# Patient Record
Sex: Male | Born: 1990 | Race: Black or African American | Hispanic: No | Marital: Single | Smoking: Current some day smoker
Health system: Southern US, Community
[De-identification: ages and names within clinical notes are randomized; demographics above are authoritative.]

## PROBLEM LIST (undated history)

## (undated) DIAGNOSIS — T148XXA Other injury of unspecified body region, initial encounter: Secondary | ICD-10-CM

## (undated) HISTORY — DX: Other injury of unspecified body region, initial encounter: T14.8XXA

---
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
## 2010-01-01 DIAGNOSIS — T148XXA Other injury of unspecified body region, initial encounter: Secondary | ICD-10-CM

## 2010-01-01 HISTORY — PX: ABDOMINAL SURGERY: SHX537

## 2010-01-01 HISTORY — DX: Other injury of unspecified body region, initial encounter: T14.8XXA

## 2010-08-21 ENCOUNTER — Emergency Department (HOSPITAL_COMMUNITY): Payer: Self-pay

## 2010-08-21 ENCOUNTER — Inpatient Hospital Stay (HOSPITAL_COMMUNITY)
Admission: EM | Admit: 2010-08-21 | Discharge: 2010-08-21 | DRG: 580 | Disposition: A | Discharge status: Home / Self Care | Payer: Self-pay | Source: Ambulatory Visit | Attending: General Surgery | Admitting: General Surgery

## 2010-08-21 DIAGNOSIS — D62 Acute posthemorrhagic anemia: Secondary | ICD-10-CM | POA: Diagnosis present

## 2010-08-21 DIAGNOSIS — S31109A Unspecified open wound of abdominal wall, unspecified quadrant without penetration into peritoneal cavity, initial encounter: Principal | ICD-10-CM | POA: Diagnosis present

## 2010-08-21 DIAGNOSIS — F1010 Alcohol abuse, uncomplicated: Secondary | ICD-10-CM | POA: Diagnosis present

## 2010-08-21 DIAGNOSIS — F17200 Nicotine dependence, unspecified, uncomplicated: Secondary | ICD-10-CM | POA: Diagnosis present

## 2010-08-21 DIAGNOSIS — F1210 Cannabis abuse, uncomplicated: Secondary | ICD-10-CM | POA: Diagnosis present

## 2010-08-21 LAB — CBC
Hemoglobin: 10.7 g/dL — ABNORMAL LOW (ref 13.0–17.0)
MCHC: 33.9 g/dL (ref 30.0–36.0)
Platelets: 251 10*3/uL (ref 150–400)
Platelets: 217 10*3/uL (ref 150–400)
RBC: 4.9 MIL/uL (ref 4.22–5.81)
WBC: 8.6 10*3/uL (ref 4.0–10.5)

## 2010-08-21 LAB — LACTIC ACID, PLASMA: Lactic Acid, Venous: 4.8 mmol/L — ABNORMAL HIGH (ref 0.5–2.2)

## 2010-08-21 LAB — COMPREHENSIVE METABOLIC PANEL
AST: 16 U/L (ref 0–37)
Albumin: 4.5 g/dL (ref 3.5–5.2)
CO2: 24 mEq/L (ref 19–32)
Calcium: 10 mg/dL (ref 8.4–10.5)
Creatinine, Ser: 1.1 mg/dL (ref 0.50–1.35)
GFR calc non Af Amer: >60 mL/min (ref >60)

## 2010-08-21 LAB — TYPE AND SCREEN: Unit division: 0

## 2010-08-21 LAB — POCT I-STAT, CHEM 8
Hemoglobin: 15.6 g/dL (ref 13.0–17.0)
Sodium: 140 meq/L (ref 135–145)
TCO2: 20 mmol/L (ref 0–100)

## 2010-08-21 LAB — ETHANOL: Alcohol, Ethyl (B): 47 mg/dL — ABNORMAL HIGH (ref 0–11)

## 2010-08-21 LAB — ABO/RH: ABO/RH(D): AB NEG

## 2010-08-21 LAB — PROTIME-INR: INR: 0.95 (ref 0.00–1.49)

## 2010-08-21 LAB — SAMPLE TO BLOOD BANK

## 2010-08-21 MED ORDER — IOHEXOL 300 MG/ML  SOLN
100.0000 mL | Freq: Once | INTRAMUSCULAR | Status: AC | PRN
  Administered 2010-08-21: 100 mL via INTRAVENOUS

## 2010-08-27 ENCOUNTER — Emergency Department (INDEPENDENT_AMBULATORY_CARE_PROVIDER_SITE_OTHER): Payer: Self-pay

## 2010-08-27 ENCOUNTER — Encounter: Payer: Self-pay | Admitting: *Deleted

## 2010-08-27 ENCOUNTER — Emergency Department (HOSPITAL_BASED_OUTPATIENT_CLINIC_OR_DEPARTMENT_OTHER)
Admission: EM | Admit: 2010-08-27 | Discharge: 2010-08-27 | Disposition: A | Discharge status: Home / Self Care | Payer: Self-pay | Attending: Emergency Medicine | Admitting: Emergency Medicine

## 2010-08-27 DIAGNOSIS — R062 Wheezing: Secondary | ICD-10-CM

## 2010-08-27 DIAGNOSIS — IMO0002 Reserved for concepts with insufficient information to code with codable children: Secondary | ICD-10-CM | POA: Insufficient documentation

## 2010-08-27 DIAGNOSIS — Y849 Medical procedure, unspecified as the cause of abnormal reaction of the patient, or of later complication, without mention of misadventure at the time of the procedure: Secondary | ICD-10-CM | POA: Insufficient documentation

## 2010-08-27 DIAGNOSIS — R509 Fever, unspecified: Secondary | ICD-10-CM

## 2010-08-27 DIAGNOSIS — R05 Cough: Secondary | ICD-10-CM

## 2010-08-27 DIAGNOSIS — J40 Bronchitis, not specified as acute or chronic: Secondary | ICD-10-CM | POA: Insufficient documentation

## 2010-08-27 DIAGNOSIS — F17200 Nicotine dependence, unspecified, uncomplicated: Secondary | ICD-10-CM | POA: Insufficient documentation

## 2010-08-27 MED ORDER — HYDROCODONE-ACETAMINOPHEN 5-325 MG PO TABS
2.0000 | ORAL_TABLET | Freq: Once | ORAL | Status: AC
  Administered 2010-08-27: 2 via ORAL
  Filled 2010-08-27: qty 2

## 2010-08-27 MED ORDER — AZITHROMYCIN 250 MG PO TABS: ORAL_TABLET | ORAL | Status: DC

## 2010-08-27 MED ORDER — IPRATROPIUM BROMIDE 0.02 % IN SOLN
RESPIRATORY_TRACT | Status: AC
  Administered 2010-08-27: 0.5 mg
  Filled 2010-08-27: qty 2.5

## 2010-08-27 MED ORDER — AZITHROMYCIN 250 MG PO TABS
500.0000 mg | ORAL_TABLET | Freq: Once | ORAL | Status: AC
  Administered 2010-08-27: 500 mg via ORAL
  Filled 2010-08-27: qty 2

## 2010-08-27 MED ORDER — ALBUTEROL SULFATE HFA 108 (90 BASE) MCG/ACT IN AERS: 2.0000 | INHALATION_SPRAY | RESPIRATORY_TRACT | Status: DC | PRN

## 2010-08-27 MED ORDER — ALBUTEROL SULFATE (5 MG/ML) 0.5% IN NEBU
INHALATION_SOLUTION | RESPIRATORY_TRACT | Status: AC
  Administered 2010-08-27: 5 mg
  Filled 2010-08-27: qty 1

## 2010-08-27 NOTE — ED Notes (Signed)
Family at bedside.Care plan reviewed wound care reviewed with pt and family benefits of Vicodan reviewed,dangers of smoking reviewed
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
## 2010-08-27 NOTE — ED Notes (Signed)
Family at bedside. 

## 2010-08-27 NOTE — ED Notes (Signed)
Pt verbalizes care plan well family at side use of meds reviewed

## 2010-08-27 NOTE — ED Notes (Signed)
Patient is resting comfortably. Bowel sounds normoactive.  Pt. tolerating PO fluids well; family at bedside; benefits of antibiotic therapy reviewed with pt and family.  States he feels better after medication.

## 2010-08-28 ENCOUNTER — Emergency Department (HOSPITAL_BASED_OUTPATIENT_CLINIC_OR_DEPARTMENT_OTHER)
Admission: EM | Admit: 2010-08-28 | Discharge: 2010-08-28 | Disposition: A | Discharge status: Home / Self Care | Payer: Self-pay | Attending: Emergency Medicine | Admitting: Emergency Medicine

## 2010-08-28 ENCOUNTER — Encounter (HOSPITAL_BASED_OUTPATIENT_CLINIC_OR_DEPARTMENT_OTHER): Payer: Self-pay | Admitting: Student

## 2010-08-28 ENCOUNTER — Emergency Department (INDEPENDENT_AMBULATORY_CARE_PROVIDER_SITE_OTHER): Payer: Self-pay

## 2010-08-28 DIAGNOSIS — R0602 Shortness of breath: Secondary | ICD-10-CM | POA: Insufficient documentation

## 2010-08-28 DIAGNOSIS — W269XXA Contact with unspecified sharp object(s), initial encounter: Secondary | ICD-10-CM

## 2010-08-28 DIAGNOSIS — F17200 Nicotine dependence, unspecified, uncomplicated: Secondary | ICD-10-CM | POA: Insufficient documentation

## 2010-08-28 DIAGNOSIS — R509 Fever, unspecified: Secondary | ICD-10-CM | POA: Insufficient documentation

## 2010-08-28 DIAGNOSIS — R109 Unspecified abdominal pain: Secondary | ICD-10-CM | POA: Insufficient documentation

## 2010-08-28 DIAGNOSIS — Z09 Encounter for follow-up examination after completed treatment for conditions other than malignant neoplasm: Secondary | ICD-10-CM

## 2010-08-28 DIAGNOSIS — S3790XA Unspecified injury of unspecified urinary and pelvic organ, initial encounter: Secondary | ICD-10-CM

## 2010-08-28 LAB — CBC
MCV: 79.5 fL (ref 78.0–100.0)
Platelets: 268 10*3/uL (ref 150–400)
RBC: 4.35 MIL/uL (ref 4.22–5.81)
WBC: 10.3 10*3/uL (ref 4.0–10.5)

## 2010-08-28 LAB — DIFFERENTIAL
Lymphocytes Relative: 13 % (ref 12–46)
Lymphs Abs: 1.4 10*3/uL (ref 0.7–4.0)
Neutro Abs: 8 10*3/uL — ABNORMAL HIGH (ref 1.7–7.7)
Neutrophils Relative %: 78 % — ABNORMAL HIGH (ref 43–77)

## 2010-08-28 LAB — BASIC METABOLIC PANEL
BUN: 18 mg/dL (ref 6–23)
Chloride: 97 mEq/L (ref 96–112)
GFR calc Af Amer: >60 mL/min (ref >60)
Glucose, Bld: 92 mg/dL (ref 70–99)
Potassium: 4 mEq/L (ref 3.5–5.1)
Sodium: 132 mEq/L — ABNORMAL LOW (ref 135–145)

## 2010-08-28 MED ORDER — ONDANSETRON HCL 4 MG/2ML IJ SOLN
4.0000 mg | Freq: Once | INTRAMUSCULAR | Status: AC
  Administered 2010-08-28: 4 mg via INTRAVENOUS
  Filled 2010-08-28: qty 2

## 2010-08-28 MED ORDER — CEPHALEXIN 500 MG PO CAPS: 500.0000 mg | ORAL_CAPSULE | Freq: Four times a day (QID) | ORAL | Status: DC

## 2010-08-28 MED ORDER — IOHEXOL 300 MG/ML  SOLN
100.0000 mL | Freq: Once | INTRAMUSCULAR | Status: AC | PRN
  Administered 2010-08-28: 100 mL via INTRAVENOUS

## 2010-08-28 MED ORDER — MORPHINE SULFATE 4 MG/ML IJ SOLN
4.0000 mg | Freq: Once | INTRAMUSCULAR | Status: AC
  Administered 2010-08-28: 4 mg via INTRAVENOUS
  Filled 2010-08-28: qty 1

## 2010-08-28 MED ORDER — MORPHINE SULFATE 4 MG/ML IJ SOLN
4.0000 mg | Freq: Once | INTRAMUSCULAR | Status: AC
  Administered 2010-08-28: 4 mg via INTRAVENOUS

## 2010-08-28 MED ORDER — CEPHALEXIN 250 MG PO CAPS
500.0000 mg | ORAL_CAPSULE | Freq: Once | ORAL | Status: AC
  Administered 2010-08-28: 500 mg via ORAL
  Filled 2010-08-28: qty 2

## 2010-08-28 MED ORDER — MORPHINE SULFATE 4 MG/ML IJ SOLN
INTRAMUSCULAR | Status: AC
  Administered 2010-08-28: 4 mg via INTRAVENOUS
  Filled 2010-08-28: qty 1

## 2010-08-28 MED ORDER — ALBUTEROL SULFATE HFA 108 (90 BASE) MCG/ACT IN AERS
2.0000 | INHALATION_SPRAY | RESPIRATORY_TRACT | Status: DC | PRN
  Administered 2010-08-28: 2 via RESPIRATORY_TRACT
  Filled 2010-08-28: qty 6.7
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
## 2010-08-29 ENCOUNTER — Telehealth (INDEPENDENT_AMBULATORY_CARE_PROVIDER_SITE_OTHER): Payer: Self-pay | Admitting: Orthopedic Surgery

## 2010-08-29 NOTE — Telephone Encounter (Signed)
Left message for patient to call us back regarding recent visits to ED.
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
## 2010-08-31 ENCOUNTER — Ambulatory Visit (INDEPENDENT_AMBULATORY_CARE_PROVIDER_SITE_OTHER): Payer: Self-pay | Admitting: Orthopedic Surgery

## 2010-08-31 ENCOUNTER — Encounter (INDEPENDENT_AMBULATORY_CARE_PROVIDER_SITE_OTHER): Payer: Self-pay

## 2010-08-31 DIAGNOSIS — L089 Local infection of the skin and subcutaneous tissue, unspecified: Secondary | ICD-10-CM | POA: Insufficient documentation

## 2010-08-31 DIAGNOSIS — S31109A Unspecified open wound of abdominal wall, unspecified quadrant without penetration into peritoneal cavity, initial encounter: Secondary | ICD-10-CM

## 2010-08-31 MED ORDER — OXYCODONE-ACETAMINOPHEN 5-325 MG PO TABS: 1.0000 | ORAL_TABLET | ORAL | Status: AC | PRN

## 2010-08-31 NOTE — Patient Instructions (Signed)
Change outer dressing daily. Did not get or packing wet. Remove packing on Sunday afternoon. Once packing is out, he may shower and wash wound with soap and water. Continue to cover with a dry dressing. If the wound is still draining by Tuesday, please call.
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
## 2011-06-18 ENCOUNTER — Emergency Department (HOSPITAL_BASED_OUTPATIENT_CLINIC_OR_DEPARTMENT_OTHER)
Admission: EM | Admit: 2011-06-18 | Discharge: 2011-06-18 | Disposition: A | Discharge status: Home / Self Care | Payer: Self-pay | Attending: Emergency Medicine | Admitting: Emergency Medicine

## 2011-06-18 ENCOUNTER — Encounter (HOSPITAL_BASED_OUTPATIENT_CLINIC_OR_DEPARTMENT_OTHER): Payer: Self-pay | Admitting: Family Medicine

## 2011-06-18 ENCOUNTER — Emergency Department (HOSPITAL_BASED_OUTPATIENT_CLINIC_OR_DEPARTMENT_OTHER): Payer: Self-pay

## 2011-06-18 DIAGNOSIS — F17200 Nicotine dependence, unspecified, uncomplicated: Secondary | ICD-10-CM | POA: Insufficient documentation

## 2011-06-18 DIAGNOSIS — S39012A Strain of muscle, fascia and tendon of lower back, initial encounter: Secondary | ICD-10-CM

## 2011-06-18 DIAGNOSIS — M25569 Pain in unspecified knee: Secondary | ICD-10-CM | POA: Insufficient documentation

## 2011-06-18 DIAGNOSIS — M549 Dorsalgia, unspecified: Secondary | ICD-10-CM | POA: Insufficient documentation

## 2011-06-18 DIAGNOSIS — M25561 Pain in right knee: Secondary | ICD-10-CM

## 2011-06-18 MED ORDER — IBUPROFEN 800 MG PO TABS
800.0000 mg | ORAL_TABLET | Freq: Once | ORAL | Status: AC
  Administered 2011-06-18: 800 mg via ORAL

## 2011-06-18 MED ORDER — HYDROCODONE-ACETAMINOPHEN 5-325 MG PO TABS: 2.0000 | ORAL_TABLET | ORAL | Status: AC | PRN

## 2011-06-18 MED ORDER — IBUPROFEN 800 MG PO TABS
ORAL_TABLET | ORAL | Status: AC
  Filled 2011-06-18: qty 1

## 2011-06-18 MED ORDER — IBUPROFEN 800 MG PO TABS: 800.0000 mg | ORAL_TABLET | Freq: Three times a day (TID) | ORAL | Status: AC

## 2011-06-18 NOTE — ED Provider Notes (Signed)
Medical screening examination/treatment/procedure(s) were performed by non-physician practitioner and as supervising physician I was immediately available for consultation/collaboration.   Senai Kingsley, MD 06/18/11 2347 

## 2011-06-18 NOTE — ED Notes (Signed)
Pt c/o low back pain and right knee pain after playing basketball yesterday.

## 2011-06-18 NOTE — ED Notes (Signed)
Returned from x ray ambulatory with steady gait 
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
## 2011-06-18 NOTE — Discharge Instructions (Signed)
Arthralgia Your caregiver has diagnosed you as suffering from an arthralgia. Arthralgia means there is pain in a joint. This can come from many reasons including:  Bruising the joint which causes soreness (inflammation) in the joint.   Wear and tear on the joints which occur as we grow older (osteoarthritis).   Overusing the joint.   Various forms of arthritis.   Infections of the joint.  Regardless of the cause of pain in your joint, most of these different pains respond to anti-inflammatory drugs and rest. The exception to this is when a joint is infected, and these cases are treated with antibiotics, if it is a bacterial infection. HOME CARE INSTRUCTIONS   Rest the injured area for as long as directed by your caregiver. Then slowly start using the joint as directed by your caregiver and as the pain allows. Crutches as directed may be useful if the ankles, knees or hips are involved. If the knee was splinted or casted, continue use and care as directed. If an stretchy or elastic wrapping bandage has been applied today, it should be removed and re-applied every 3 to 4 hours. It should not be applied tightly, but firmly enough to keep swelling down. Watch toes and feet for swelling, bluish discoloration, coldness, numbness or excessive pain. If any of these problems (symptoms) occur, remove the ace bandage and re-apply more loosely. If these symptoms persist, contact your caregiver or return to this location.   For the first 24 hours, keep the injured extremity elevated on pillows while lying down.   Apply ice for 15 to 20 minutes to the sore joint every couple hours while awake for the first half day. Then 3 to 4 times per day for the first 48 hours. Put the ice in a plastic bag and place a towel between the bag of ice and your skin.   Wear any splinting, casting, elastic bandage applications, or slings as instructed.   Only take over-the-counter or prescription medicines for pain,  discomfort, or fever as directed by your caregiver. Do not use aspirin immediately after the injury unless instructed by your physician. Aspirin can cause increased bleeding and bruising of the tissues.   If you were given crutches, continue to use them as instructed and do not resume weight bearing on the sore joint until instructed.  Persistent pain and inability to use the sore joint as directed for more than 2 to 3 days are warning signs indicating that you should see a caregiver for a follow-up visit as soon as possible. Initially, a hairline fracture (break in bone) may not be evident on X-rays. Persistent pain and swelling indicate that further evaluation, non-weight bearing or use of the joint (use of crutches or slings as instructed), or further X-rays are indicated. X-rays may sometimes not show a small fracture until a week or 10 days later. Make a follow-up appointment with your own caregiver or one to whom we have referred you. A radiologist (specialist in reading X-rays) may read your X-rays. Make sure you know how you are to obtain your X-ray results. Do not assume everything is normal if you do not hear from us. SEEK MEDICAL CARE IF: Bruising, swelling, or pain increases. SEEK IMMEDIATE MEDICAL CARE IF:   Your fingers or toes are numb or blue.   The pain is not responding to medications and continues to stay the same or get worse.   The pain in your joint becomes severe.   You develop a fever over   102 F (38.9 C).   It becomes impossible to move or use the joint.  MAKE SURE YOU:   Understand these instructions.   Will watch your condition.   Will get help right away if you are not doing well or get worse.  Document Released: 12/18/2004 Document Revised: 12/07/2010 Document Reviewed: 08/06/2007 [facility] Patient Information 2012 [place], [place].Back Exercises Back exercises help treat and prevent back injuries. The goal of back exercises is to increase the strength of your  abdominal and back muscles and the flexibility of your back. These exercises should be started when you no longer have back pain. Back exercises include:  Pelvic Tilt. Lie on your back with your knees bent. Tilt your pelvis until the lower part of your back is against the floor. Hold this position 5 to 10 sec and repeat 5 to 10 times.   Knee to Chest. Pull first 1 knee up against your chest and hold for 20 to 30 seconds, repeat this with the other knee, and then both knees. This may be done with the other leg straight or bent, whichever feels better.   Sit-Ups or Curl-Ups. Bend your knees 90 degrees. Start with tilting your pelvis, and do a partial, slow sit-up, lifting your trunk only 30 to 45 degrees off the floor. Take at least 2 to 3 seconds for each sit-up. Do not do sit-ups with your knees out straight. If partial sit-ups are difficult, simply do the above but with only tightening your abdominal muscles and holding it as directed.   Hip-Lift. Lie on your back with your knees flexed 90 degrees. Push down with your feet and shoulders as you raise your hips a couple inches off the floor; hold for 10 seconds, repeat 5 to 10 times.   Back arches. Lie on your stomach, propping yourself up on bent elbows. Slowly press on your hands, causing an arch in your low back. Repeat 3 to 5 times. Any initial stiffness and discomfort should lessen with repetition over time.   Shoulder-Lifts. Lie face down with arms beside your body. Keep hips and torso pressed to floor as you slowly lift your head and shoulders off the floor.  Do not overdo your exercises, especially in the beginning. Exercises may cause you some mild back discomfort which lasts for a few minutes; however, if the pain is more severe, or lasts for more than 15 minutes, do not continue exercises until you see your caregiver. Improvement with exercise therapy for back problems is slow.  See your caregivers for assistance with developing a proper back  exercise program. Document Released: 01/26/2004 Document Revised: 12/07/2010 Document Reviewed: 12/18/2004 [facility] Patient Information 2012 [place], [place].
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
## 2012-10-08 ENCOUNTER — Encounter (HOSPITAL_BASED_OUTPATIENT_CLINIC_OR_DEPARTMENT_OTHER): Payer: Self-pay | Admitting: Emergency Medicine

## 2012-10-08 ENCOUNTER — Emergency Department (HOSPITAL_BASED_OUTPATIENT_CLINIC_OR_DEPARTMENT_OTHER)
Admission: EM | Admit: 2012-10-08 | Discharge: 2012-10-08 | Disposition: A | Discharge status: Home / Self Care | Payer: Self-pay | Attending: Emergency Medicine | Admitting: Emergency Medicine

## 2012-10-08 DIAGNOSIS — IMO0001 Reserved for inherently not codable concepts without codable children: Secondary | ICD-10-CM | POA: Insufficient documentation

## 2012-10-08 DIAGNOSIS — Z87828 Personal history of other (healed) physical injury and trauma: Secondary | ICD-10-CM | POA: Insufficient documentation

## 2012-10-08 DIAGNOSIS — F17200 Nicotine dependence, unspecified, uncomplicated: Secondary | ICD-10-CM | POA: Insufficient documentation

## 2012-10-08 DIAGNOSIS — L089 Local infection of the skin and subcutaneous tissue, unspecified: Secondary | ICD-10-CM | POA: Insufficient documentation

## 2012-10-08 DIAGNOSIS — M7989 Other specified soft tissue disorders: Secondary | ICD-10-CM | POA: Insufficient documentation

## 2012-10-08 LAB — GLUCOSE, CAPILLARY

## 2012-10-08 MED ORDER — HYDROCODONE-ACETAMINOPHEN 5-325 MG PO TABS: 2.0000 | ORAL_TABLET | ORAL | Status: DC | PRN

## 2012-10-08 MED ORDER — CLOTRIMAZOLE 1 % EX CREA: TOPICAL_CREAM | CUTANEOUS | Status: DC

## 2012-10-08 MED ORDER — SULFAMETHOXAZOLE-TRIMETHOPRIM 800-160 MG PO TABS: 1.0000 | ORAL_TABLET | Freq: Two times a day (BID) | ORAL | Status: DC

## 2012-10-08 NOTE — ED Notes (Signed)
Pain to right 2nd toe x 2 days-pain to left 3rd toe last week-denies injury to both sites
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
## 2012-10-08 NOTE — ED Provider Notes (Signed)
Medical screening examination/treatment/procedure(s) were performed by non-physician practitioner and as supervising physician I was immediately available for consultation/collaboration.    Christopher J. Pollina, MD 10/08/12 2219 

## 2016-12-16 ENCOUNTER — Other Ambulatory Visit: Payer: Self-pay

## 2016-12-16 ENCOUNTER — Emergency Department (HOSPITAL_BASED_OUTPATIENT_CLINIC_OR_DEPARTMENT_OTHER): Payer: Self-pay

## 2016-12-16 ENCOUNTER — Encounter (HOSPITAL_BASED_OUTPATIENT_CLINIC_OR_DEPARTMENT_OTHER): Payer: Self-pay | Admitting: *Deleted

## 2016-12-16 ENCOUNTER — Emergency Department (HOSPITAL_BASED_OUTPATIENT_CLINIC_OR_DEPARTMENT_OTHER)
Admission: EM | Admit: 2016-12-16 | Discharge: 2016-12-16 | Payer: Self-pay | Attending: Physician Assistant | Admitting: Physician Assistant

## 2016-12-16 DIAGNOSIS — M546 Pain in thoracic spine: Secondary | ICD-10-CM

## 2016-12-16 DIAGNOSIS — M545 Low back pain, unspecified: Secondary | ICD-10-CM

## 2016-12-16 DIAGNOSIS — M62838 Other muscle spasm: Secondary | ICD-10-CM

## 2016-12-16 DIAGNOSIS — F1210 Cannabis abuse, uncomplicated: Secondary | ICD-10-CM | POA: Insufficient documentation

## 2016-12-16 DIAGNOSIS — F17200 Nicotine dependence, unspecified, uncomplicated: Secondary | ICD-10-CM | POA: Insufficient documentation

## 2016-12-16 MED ORDER — CYCLOBENZAPRINE HCL 10 MG PO TABS: 10.0000 mg | ORAL_TABLET | Freq: Two times a day (BID) | ORAL | 0 refills | Status: DC | PRN

## 2016-12-16 MED ORDER — IBUPROFEN 800 MG PO TABS
800.0000 mg | ORAL_TABLET | Freq: Once | ORAL | Status: AC
  Administered 2016-12-16: 800 mg via ORAL
  Filled 2016-12-16: qty 1

## 2016-12-16 MED ORDER — CYCLOBENZAPRINE HCL 5 MG PO TABS
5.0000 mg | ORAL_TABLET | Freq: Once | ORAL | Status: AC
  Administered 2016-12-16: 5 mg via ORAL
  Filled 2016-12-16: qty 1

## 2016-12-16 NOTE — ED Notes (Addendum)
All wet clothing bagged and sent with [employer].
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
## 2016-12-16 NOTE — ED Notes (Signed)
Ice pack given. D/c instructions provided to pt and to [employer]. RX for flexeril given along with paperwork to officer.
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
## 2016-12-16 NOTE — Discharge Instructions (Signed)
Alternate 600 mg of ibuprofen and [id number] mg of Tylenol every 3 hours as needed for pain. Do not exceed 4000 mg of Tylenol daily. You may take Flexeril up to twice daily as needed for muscle spasms. This medication may make you drowsy, so I typically only recommended at night. If this medication makes you drowsy throughout the day, no driving, drinking alcohol, or operating heavy machinery. You may also cut these tablets in half. Apply ice or heat to affected areas for comfort.. Do some gentle stretching throughout the day, especially during hot showers or baths. Take short frequent walks and avoid prolonged periods of sitting or laying. Expect to be sore for the next few day and follow up with primary care physician for recheck of ongoing symptoms but return to ER for emergent changing or worsening of symptoms such as severe headache that gets worse, altered mental status/behaving unusually, persistent vomiting, excessive drowsiness, numbness to the arms or legs, unsteady gait, or slurred speech.

## 2017-08-29 ENCOUNTER — Emergency Department (HOSPITAL_BASED_OUTPATIENT_CLINIC_OR_DEPARTMENT_OTHER)
Admission: EM | Admit: 2017-08-29 | Discharge: 2017-08-29 | Disposition: A | Discharge status: Home / Self Care | Payer: Self-pay | Attending: Emergency Medicine | Admitting: Emergency Medicine

## 2017-08-29 ENCOUNTER — Other Ambulatory Visit: Payer: Self-pay

## 2017-08-29 ENCOUNTER — Encounter (HOSPITAL_BASED_OUTPATIENT_CLINIC_OR_DEPARTMENT_OTHER): Payer: Self-pay

## 2017-08-29 DIAGNOSIS — B349 Viral infection, unspecified: Secondary | ICD-10-CM | POA: Insufficient documentation

## 2017-08-29 DIAGNOSIS — F17210 Nicotine dependence, cigarettes, uncomplicated: Secondary | ICD-10-CM | POA: Insufficient documentation

## 2017-08-29 DIAGNOSIS — B9789 Other viral agents as the cause of diseases classified elsewhere: Secondary | ICD-10-CM

## 2017-08-29 DIAGNOSIS — Z79899 Other long term (current) drug therapy: Secondary | ICD-10-CM | POA: Insufficient documentation

## 2017-08-29 DIAGNOSIS — J069 Acute upper respiratory infection, unspecified: Secondary | ICD-10-CM | POA: Insufficient documentation

## 2017-08-29 MED ORDER — IBUPROFEN 800 MG PO TABS
800.0000 mg | ORAL_TABLET | Freq: Once | ORAL | Status: AC
  Administered 2017-08-29: 800 mg via ORAL
  Filled 2017-08-29: qty 1

## 2017-08-29 NOTE — ED Triage Notes (Signed)
C/o flu like sx day 3-NAD-steady gait 

## 2017-08-29 NOTE — Discharge Instructions (Addendum)
Your symptoms are most likely from a virus that has caused an upper respiratory infection. A viral illness typically peaks on day 2-3 and resolves after one week.    The main treatment approach for a viral upper respiratory infection is to treat the symptoms, support your immune system and prevent spread of illness.   Stay well-hydrated. Rest. You can use over the counter medications to help with symptoms: 600 mg ibuprofen (motrin, advil) or acetaminophen (tylenol) every 6 hours, around the clock to help with associated fevers, sore throat, headaches, generalized body aches and malaise.  Oxymetazoline (afrin) intranasal spray once daily for no more than 3 days to help with congestion, after 3 days you can switch to another over-the-counter nasal steroid spray such as fluticasone (flonase) Allergy medication (loratadine, cetirizine, etc) and phenylephrine (sudafed) help with nasal congestion, runny nose and postnasal drip.   Dextromethorphan containing to suppress cough   Wash your hands often to prevent spread.   A viral upper respiratory infection can also worsen and progress into pneumonia.  Monitor your symptoms. If your symptoms worsen, persist and you develop persistent fevers, chest pain, productive cough you should follow up with your primary care provider.
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
## 2017-12-24 ENCOUNTER — Encounter (HOSPITAL_COMMUNITY): Payer: Self-pay | Admitting: Emergency Medicine

## 2017-12-24 ENCOUNTER — Other Ambulatory Visit: Payer: Self-pay

## 2017-12-24 ENCOUNTER — Emergency Department (HOSPITAL_COMMUNITY): Payer: Self-pay

## 2017-12-24 ENCOUNTER — Emergency Department (HOSPITAL_COMMUNITY)
Admission: EM | Admit: 2017-12-24 | Discharge: 2017-12-24 | Disposition: A | Discharge status: Home / Self Care | Payer: Self-pay | Attending: Emergency Medicine | Admitting: Emergency Medicine

## 2017-12-24 DIAGNOSIS — Y939 Activity, unspecified: Secondary | ICD-10-CM | POA: Insufficient documentation

## 2017-12-24 DIAGNOSIS — Y998 Other external cause status: Secondary | ICD-10-CM | POA: Insufficient documentation

## 2017-12-24 DIAGNOSIS — S81802A Unspecified open wound, left lower leg, initial encounter: Secondary | ICD-10-CM | POA: Insufficient documentation

## 2017-12-24 DIAGNOSIS — W3400XA Accidental discharge from unspecified firearms or gun, initial encounter: Secondary | ICD-10-CM

## 2017-12-24 DIAGNOSIS — F17210 Nicotine dependence, cigarettes, uncomplicated: Secondary | ICD-10-CM | POA: Insufficient documentation

## 2017-12-24 DIAGNOSIS — S8992XA Unspecified injury of left lower leg, initial encounter: Secondary | ICD-10-CM

## 2017-12-24 DIAGNOSIS — Y92410 Unspecified street and highway as the place of occurrence of the external cause: Secondary | ICD-10-CM | POA: Insufficient documentation

## 2017-12-24 MED ORDER — OXYCODONE-ACETAMINOPHEN 5-325 MG PO TABS: 1.0000 | ORAL_TABLET | ORAL | 0 refills | Status: DC | PRN

## 2017-12-24 MED ORDER — OXYCODONE-ACETAMINOPHEN 5-325 MG PO TABS
1.0000 | ORAL_TABLET | Freq: Once | ORAL | Status: AC
  Administered 2017-12-24: 1 via ORAL
  Filled 2017-12-24: qty 1

## 2017-12-24 MED ORDER — FENTANYL CITRATE (PF) 100 MCG/2ML IJ SOLN
50.0000 ug | Freq: Once | INTRAMUSCULAR | Status: AC
  Administered 2017-12-24: 50 ug via INTRAVENOUS
  Filled 2017-12-24: qty 2
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
## 2017-12-24 NOTE — ED Notes (Signed)
ED Provider at bedside. 
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
## 2021-04-26 ENCOUNTER — Encounter (HOSPITAL_BASED_OUTPATIENT_CLINIC_OR_DEPARTMENT_OTHER): Payer: Self-pay | Admitting: Emergency Medicine

## 2021-04-26 ENCOUNTER — Emergency Department (HOSPITAL_BASED_OUTPATIENT_CLINIC_OR_DEPARTMENT_OTHER)
Admission: EM | Admit: 2021-04-26 | Discharge: 2021-04-26 | Disposition: A | Discharge status: Home / Self Care | Payer: Self-pay | Attending: Emergency Medicine | Admitting: Emergency Medicine

## 2021-04-26 ENCOUNTER — Other Ambulatory Visit: Payer: Self-pay

## 2021-04-26 DIAGNOSIS — K5900 Constipation, unspecified: Secondary | ICD-10-CM | POA: Insufficient documentation

## 2021-04-26 MED ORDER — SENNA 8.6 MG PO TABS: 1.0000 | ORAL_TABLET | Freq: Two times a day (BID) | ORAL | 0 refills | Status: DC

## 2021-04-26 NOTE — ED Provider Notes (Signed)
?[facility] EMERGENCY DEPARTMENT ?Provider Note ? ? ?CSN: [id number] ?Arrival date & time: 04/26/21  1052 ? ?  ? ?History ? ?Chief Complaint  ?Patient presents with  ? Constipation  ? ? ?Juan Patterson is a 31 y.o. male.  Patient presents with 2 weeks of constipation.  Patient states he has had liquid bowel movements but no solid bowel movements over the past 2 weeks.  Patient has tried a liquid laxative and Dulcolax at home.  Patient denies drinking water, states he rarely drinks water and only drinks Sprite.  Denies recent surgeries, denies opiate usage. Endorses constipation. Denies abdominal pain, nausea, vomiting. No relevant PMH ? ?HPI ? ?  ? ?Home Medications ?Prior to Admission medications   ?Medication Sig Start Date End Date Taking? Authorizing Provider  ?senna (SENOKOT) 8.6 MG TABS tablet Take 1 tablet (8.6 mg total) by mouth every 12 (twelve) hours. 04/26/21  Yes Darrick Grinder, PA-C  ?albuterol (PROVENTIL HFA;VENTOLIN HFA) 108 (90 BASE) MCG/ACT inhaler Inhale 2 puffs into the lungs every 4 (four) hours as needed. Patient uses this medication prn. 08/27/10 08/27/11  Cathren Laine, MD  ?HYDROcodone-acetaminophen (NORCO/VICODIN) 5-325 MG per tablet Take 2 tablets by mouth every 4 (four) hours as needed for pain. 10/08/12   Elson Areas, PA-C  ?oxyCODONE-acetaminophen (PERCOCET/ROXICET) 5-325 MG tablet Take 1 tablet by mouth every 4 (four) hours as needed for severe pain. 12/24/17   Tegeler, Canary Brim, MD  ?   ? ?Allergies    ?Patient has no known allergies.   ? ?Review of Systems   ?Review of Systems  ?Constitutional:  Negative for fever.  ?Respiratory:  Negative for shortness of breath.   ?Cardiovascular:  Negative for chest pain.  ?Gastrointestinal:  Positive for constipation. Negative for abdominal pain, nausea and vomiting.  ? ?Physical Exam ?Updated Vital Signs ?BP (!) 138/95 (BP Location: Right Arm)   Pulse 76   Temp 99 ?F (37.2 ?C) (Oral)   Resp 20   Ht 6\' 5"  (1.956 m)   Wt 95.3 kg    SpO2 100%   BMI 24.90 kg/m?  ?Physical Exam ?Vitals and nursing note reviewed.  ?Constitutional:   ?   General: He is not in acute distress. ?HENT:  ?   Head: Normocephalic.  ?Eyes:  ?   Conjunctiva/sclera: Conjunctivae normal.  ?Cardiovascular:  ?   Rate and Rhythm: Normal rate and regular rhythm.  ?   Pulses: Normal pulses.  ?   Heart sounds: Normal heart sounds.  ?Pulmonary:  ?   Effort: Pulmonary effort is normal.  ?   Breath sounds: Normal breath sounds.  ?Abdominal:  ?   General: Bowel sounds are normal.  ?   Palpations: Abdomen is soft.  ?   Tenderness: There is no abdominal tenderness.  ?Genitourinary: ?   Comments: Deferred ?Musculoskeletal:     ?   General: Normal range of motion.  ?   Cervical back: Normal range of motion.  ?Skin: ?   General: Skin is warm and dry.  ?Neurological:  ?   Mental Status: He is alert and oriented to person, place, and time.  ? ? ?ED Results / Procedures / Treatments   ?Labs ?(all labs ordered are listed, but only abnormal results are displayed) ?Labs Reviewed - No data to display ? ?EKG ?None ? ?Radiology ?No results found. ? ?Procedures ?Procedures  ? ? ?Medications Ordered in ED ?Medications - No data to display ? ?ED Course/ Medical Decision Making/ A&P ?  ?                        ?  Medical Decision Making ? ?The patient presents with a chief complaint of constipation.  Differential includes but is not limited to functional constipation, opioid-induced constipation, small bowel obstruction, and others ? ?I recommended CT scan of the patient's abdomen to evaluate for possible small bowel obstruction.  The patient refused this study at this time.  I discussed risks of small bowel obstruction with patient. Patient states that he would rather discharge home and try more medication prior to having the CT scan. ? ?Discharge home with bowel regimen and increase water intake.  Return precautions provided.  Patient plans to return if unable to have bowel movement. ? ?Final  Clinical Impression(s) / ED Diagnoses ?Final diagnoses:  ?Constipation, unspecified constipation type  ? ? ?Rx / DC Orders ?ED Discharge Orders   ? ?      Ordered  ?  senna (SENOKOT) 8.6 MG TABS tablet  Every 12 hours       ? 04/26/21 1211  ? ?  ?  ? ?  ? ? ?  ?Darrick Grinder, PA-C ?04/26/21 1212 ? ?  ?Terrilee Files, MD ?04/26/21 1658 ? ?

## 2021-04-26 NOTE — ED Notes (Signed)
Client refuses any lab work, ED PA aware.  ?

## 2021-04-26 NOTE — ED Triage Notes (Signed)
Pt c/o constipation x 2 weeks. Pt states he has had a couple of small diarrhea BMs. Pt reports trying dulcolax, and a stool softener. Pt took mad citrate yesterday and had another liquid BM.  ?

## 2021-04-26 NOTE — ED Notes (Signed)
AVS reviewed with client, provided information on Rx sent electronically to the pharmacy of his request. Also discussed diet to aid in having a BM. Informed client to return if having severe abd pain, having nausea or vomiting. Pt did not want any lab work, x-rays or declined additional vs prior to DC to home with girlfriend ?

## 2021-04-26 NOTE — Discharge Instructions (Addendum)
You were seen today for constipation. I recommended a CT scan to evaluate for possible bowel obstruction. You refused at this time. I recommend trying the addition of Senna, a laxative, to your other bowel medications. Please be sure to drink plenty of water. If you continue to be unable to have a bowel movement, I recommend return visit for CT to rule out obstruction and for further evaluation.  ?

## 2021-05-01 ENCOUNTER — Emergency Department (HOSPITAL_BASED_OUTPATIENT_CLINIC_OR_DEPARTMENT_OTHER): Payer: Self-pay

## 2021-05-01 ENCOUNTER — Encounter (HOSPITAL_BASED_OUTPATIENT_CLINIC_OR_DEPARTMENT_OTHER): Payer: Self-pay | Admitting: Urology

## 2021-05-01 ENCOUNTER — Emergency Department (HOSPITAL_BASED_OUTPATIENT_CLINIC_OR_DEPARTMENT_OTHER)
Admission: EM | Admit: 2021-05-01 | Discharge: 2021-05-01 | Disposition: A | Discharge status: Home / Self Care | Payer: Self-pay | Attending: Emergency Medicine | Admitting: Emergency Medicine

## 2021-05-01 ENCOUNTER — Other Ambulatory Visit: Payer: Self-pay

## 2021-05-01 ENCOUNTER — Telehealth: Payer: Self-pay

## 2021-05-01 DIAGNOSIS — R1110 Vomiting, unspecified: Secondary | ICD-10-CM | POA: Insufficient documentation

## 2021-05-01 DIAGNOSIS — R1084 Generalized abdominal pain: Secondary | ICD-10-CM

## 2021-05-01 DIAGNOSIS — K5900 Constipation, unspecified: Secondary | ICD-10-CM | POA: Insufficient documentation

## 2021-05-01 LAB — CBC WITH DIFFERENTIAL/PLATELET
Abs Immature Granulocytes: 0.03 10*3/uL (ref 0.00–0.07)
Basophils Absolute: 0 10*3/uL (ref 0.0–0.1)
Basophils Relative: 0 %
Eosinophils Absolute: 0.1 10*3/uL (ref 0.0–0.5)
Eosinophils Relative: 1 %
HCT: 42.7 % (ref 39.0–52.0)
Hemoglobin: 14.1 g/dL (ref 13.0–17.0)
Immature Granulocytes: 0 %
Lymphocytes Relative: 39 %
Lymphs Abs: 3.6 10*3/uL (ref 0.7–4.0)
MCH: 28.4 pg (ref 26.0–34.0)
MCHC: 33 g/dL (ref 30.0–36.0)
MCV: 85.9 fL (ref 80.0–100.0)
Monocytes Absolute: 0.7 10*3/uL (ref 0.1–1.0)
Monocytes Relative: 8 %
Neutro Abs: 4.6 10*3/uL (ref 1.7–7.7)
Neutrophils Relative %: 52 %
Platelets: 336 10*3/uL (ref 150–400)
RBC: 4.97 MIL/uL (ref 4.22–5.81)
RDW: 14.2 % (ref 11.5–15.5)
WBC: 9.1 10*3/uL (ref 4.0–10.5)
nRBC: 0 % (ref 0.0–0.2)

## 2021-05-01 LAB — COMPREHENSIVE METABOLIC PANEL
ALT: 11 U/L (ref 0–44)
AST: 14 U/L — ABNORMAL LOW (ref 15–41)
Albumin: 4.2 g/dL (ref 3.5–5.0)
Alkaline Phosphatase: 38 U/L (ref 38–126)
Anion gap: 8 (ref 5–15)
BUN: 8 mg/dL (ref 6–20)
CO2: 29 mmol/L (ref 22–32)
Calcium: 9.5 mg/dL (ref 8.9–10.3)
Chloride: 100 mmol/L (ref 98–111)
Creatinine, Ser: 1.02 mg/dL (ref 0.61–1.24)
GFR, Estimated: >60 mL/min (ref >60)
Glucose, Bld: 90 mg/dL (ref 70–99)
Potassium: 3.4 mmol/L — ABNORMAL LOW (ref 3.5–5.1)
Sodium: 137 mmol/L (ref 135–145)
Total Bilirubin: 0.8 mg/dL (ref 0.3–1.2)
Total Protein: 8.1 g/dL (ref 6.5–8.1)

## 2021-05-01 LAB — URINALYSIS, ROUTINE W REFLEX MICROSCOPIC
Glucose, UA: NEGATIVE mg/dL
Hgb urine dipstick: NEGATIVE
Ketones, ur: NEGATIVE mg/dL
Leukocytes,Ua: NEGATIVE
Nitrite: NEGATIVE
Protein, ur: NEGATIVE mg/dL
Specific Gravity, Urine: >=1.03 (ref 1.005–1.030)
pH: 5.5 (ref 5.0–8.0)

## 2021-05-01 LAB — LIPASE, BLOOD: Lipase: 34 U/L (ref 11–51)

## 2021-05-01 MED ORDER — IOHEXOL 300 MG/ML  SOLN
100.0000 mL | Freq: Once | INTRAMUSCULAR | Status: AC | PRN
  Administered 2021-05-01: 100 mL via INTRAVENOUS

## 2021-05-01 MED ORDER — FENTANYL CITRATE PF 50 MCG/ML IJ SOSY
50.0000 ug | PREFILLED_SYRINGE | Freq: Once | INTRAMUSCULAR | Status: AC
  Administered 2021-05-01: 50 ug via INTRAVENOUS
  Filled 2021-05-01: qty 1

## 2021-05-01 MED ORDER — DOCUSATE SODIUM 100 MG PO CAPS: 100.0000 mg | ORAL_CAPSULE | Freq: Two times a day (BID) | ORAL | 0 refills | Status: DC

## 2021-05-01 NOTE — ED Triage Notes (Signed)
Constipation worsening since seen on 4/26, states having small stools ?States abdominal pain in lower abdomen and rectal pain when sitting ? ?

## 2021-05-01 NOTE — Discharge Instructions (Addendum)
Stop all of your medications except for the Colace.  Increase your fiber intake.  Be sure you are staying hydrated ? ?Please try to follow-up with one of the gastroenterology doctors in the next month.  You may need a colonoscopy for further evaluation.  You will also need to have another CAT scan of your abdomen done in the next 3 months to see if there is any increasing inflammation in your abdomen or any other serious causes of your constipation ?

## 2021-05-01 NOTE — Telephone Encounter (Signed)
RNCM left contact information for call back after 11am tomorrow. Patient's mother advised he is available anytime for PCP appt.  ?
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
## 2021-05-02 ENCOUNTER — Telehealth: Payer: Self-pay

## 2021-05-02 NOTE — Telephone Encounter (Signed)
RNCM spoke with patient's mother who states he was not available, however she received a phone message from [facility] re: his appt on 05/08/21 at 1:15pm. ? ?No additional TOC needs ?

## 2021-05-02 NOTE — Telephone Encounter (Signed)
RNCM received inbound call from patient. RNCM advised of his scheduled appt on 05/08/21 at 1:15p, $25 copay. No additional TOC needs  ?

## 2021-05-08 ENCOUNTER — Ambulatory Visit (INDEPENDENT_AMBULATORY_CARE_PROVIDER_SITE_OTHER): Payer: Self-pay | Admitting: Internal Medicine

## 2021-05-08 ENCOUNTER — Other Ambulatory Visit: Payer: Self-pay

## 2021-05-08 ENCOUNTER — Encounter: Payer: Self-pay | Admitting: Internal Medicine

## 2021-05-08 VITALS — BP 108/71 | HR 73 | Temp 98.7°F | Ht 76.0 in | Wt 198.1 lb

## 2021-05-08 DIAGNOSIS — F17210 Nicotine dependence, cigarettes, uncomplicated: Secondary | ICD-10-CM

## 2021-05-08 DIAGNOSIS — K5900 Constipation, unspecified: Secondary | ICD-10-CM | POA: Insufficient documentation

## 2021-05-08 DIAGNOSIS — F1010 Alcohol abuse, uncomplicated: Secondary | ICD-10-CM

## 2021-05-08 DIAGNOSIS — K769 Liver disease, unspecified: Secondary | ICD-10-CM | POA: Insufficient documentation

## 2021-05-08 DIAGNOSIS — F1210 Cannabis abuse, uncomplicated: Secondary | ICD-10-CM

## 2021-05-08 DIAGNOSIS — R599 Enlarged lymph nodes, unspecified: Secondary | ICD-10-CM | POA: Insufficient documentation

## 2021-05-08 DIAGNOSIS — Z5989 Other problems related to housing and economic circumstances: Secondary | ICD-10-CM

## 2021-05-08 NOTE — Assessment & Plan Note (Addendum)
Patient presented to the emergency room on 5/01 for abdominal pain.  Patient reported constipation, has been trying several different over-the-counter laxatives including 1 enema without success.  CT abdomen pelvis performed in the ED showed no obstruction, no large stool burden.  Patient was instructed to stop taking laxatives and take only Colace, increase fiber intake, increase fluid intake at discharge from the ED. ? ?Patient presents for ED follow-up appointment and to establish care.  He reports finally having a bowel movement on Friday 5/5.  In the interim he has increased his fluid intake, stopped eating meat, increase his vegetable intake, and taking the Colace.  Since Friday he has had multiple bowel movements.  He has had an improvement in his pelvic pain and rectal pain.  Prior to Friday he had been having pelvic and rectal pain which was constant and worsening with straining to have a bowel movement.  He denied changes in pain in association with mealtimes.  On Friday he did note bright red blood per rectum on the tissue when he wiped.  Rectal exam performed in the ED without frank blood, melena/hematochezia, rectal mass, other rectal lesion or external hemorrhoid.  On exam today patient's abdominal exam unremarkable aside from mild tenderness to palpation intermittently left lower quadrant.  Patient denies taking chronic opioid pain medication, iron supplements. ? ?On assessment, though CT abdomen pelvis did not show large stool burden, patient's symptoms consistent with constipation given that his symptoms resolved after large bowel movements the last few days.  His rectal pain has also improved since he is no longer needing to strain for a bowel movement.  Suspect bright red blood per rectum from hemorrhoidal bleeding due to straining, though reassuring that no external hemorrhoids were noted on rectal exam in the ED.  If patient continues to have abdominal pain, sensation of constipation, black  stools or blood per rectum despite conservative management, patient may benefit from referral to GI and colonoscopy to assess for obstructive etiology of constipation.  Unfortunately patient is currently uninsured, patient provided with paperwork for orange card today.  Once he obtains this we may be able to send the referral if needed. Low concern for hypothyroidism as etiology of constipation since patient has no other symptoms of hypothyroidism.  ?Plan: ?-Continue to drink p.o. fluids ?-Try MiraLAX daily or twice daily ?-Take either senna or Colace or Dulcolax which ever you found most helpful ? ?

## 2021-05-08 NOTE — Patient Instructions (Signed)
Thank you, [name] for allowing [place] to provide your care today. Today we discussed: ? ?Constipation/ Stomach pain: Please continue to drink plenty of water, eat fiber, and use over-the-counter medications for constipation.  I usually recommend MiraLAX powder mixed into what ever drink 1-2 times a day.  You can also use Colace, senna, Dulcolax which ever has been working for you.  I suspect the rectal pain and bleeding is from hemorrhoids that can develop when you are straining to have a bowel movement.  Fortunately the emergency room provider did not see any large hemorrhoids on his exam but if these continue to be a problem we could do another rectal exam in the future improved provide creams or other treatments that can help with hemorrhoid pain. ? ?Swollen lymph node in the left groin area: If you notice a lymph node in the left groin or any lymph nodes start to swell and become larger please give [place] a call to make an appointment to have this checked out.  Usually these are not serious but if the lymph node is becoming larger over period of time, we would want to take another look and do more imaging. ? ?Please fill out the paperwork that was provided to you today.  I will reach out to our social worker to contact you to follow-up to see if you need any help filling out.  By filling out the paperwork we might be able to get to some sort of healthcare coverage that would allow [place] to send you to our gastroenterology specialist who could perform a colonoscopy if needed. ? ?My Chart Access: ?[email or web address] ? ?Please follow-up in 1 year or call to make an earlier appointment if needed for any reason. ? ?Please make sure to arrive 15 minutes prior to your next appointment. If you arrive late, you may be asked to reschedule.  ?  ?We look forward to seeing you next time. Please call our clinic at [phone] if you have any questions or concerns. The best time to call  is Monday-Friday from 9am-4pm, but there is someone available 24/7. If after hours or the weekend, call the main hospital number and ask for the Internal Medicine Resident On-Call. If you need medication refills, please notify your pharmacy one week in advance and they will send [place] a request. ?  ?Thank you for letting us take part in your care. Wishing you the best! ? ?Wayland Denis, MD ?05/08/2021, 2:21 PM ?IM Resident, PGY-1 ? ?

## 2021-05-08 NOTE — Assessment & Plan Note (Signed)
Patient noted to have mildly enlarged left external iliac lymph node on CT scan during evaluation in the ED.  Discussed with patient what her lymph node is, what it means for her lymph node to swell and become reactive.  Patient denies pain in the groin area, feeling enlarged lymph node, symptoms such as penile discharge, dysuria, new lesions/ulcers in the genital area.  Patient declines lymph node exam today.  Discussed with patient self monitoring lymph nodes to ensure that he has no rapidly enlarging lymph nodes which may represent other underlying etiology besides infection/inflammation. ?

## 2021-05-08 NOTE — Assessment & Plan Note (Signed)
Patient had incidental imaging finding of 18 mm subcapsular left lobe liver lesion which may be cyst with proteinaceous debris or hemorrhage.  Radiology recommended follow-up with MRI though patient is uninsured.  Once patient obtains some form of health insurance could consider reevaluation with MRI. ?
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
## 2021-05-09 ENCOUNTER — Telehealth: Payer: Self-pay | Admitting: *Deleted

## 2021-05-09 NOTE — Chronic Care Management (AMB) (Signed)
  Care Management   Outreach Note  05/09/2021 Name: Juan Patterson MRN: [id number] DOB: April 07, 1990  Referred by: PCP Ellison Carwin, MD Reason for referral : Care Coordination (Initial outreach to schedule referral with BSW )   An unsuccessful telephone outreach was attempted today. The patient was referred to the case management team for assistance with care management and care coordination.   Follow Up Plan:  A HIPAA compliant phone message was left for the patient providing contact information and requesting a return call.  The care management team will reach out to the patient again over the next 7 days.  If patient returns call to provider office, please advise to call Embedded Care Management Care Guide Misty Stanley* at [phone].Gwenevere Ghazi  Care Guide, Embedded Care Coordination [facility] Management  Direct Dial: [phone]

## 2021-05-12 NOTE — Progress Notes (Signed)
Internal Medicine Clinic Attending ? ?Case discussed with Dr. Zinoviev  At the time of the visit.  We reviewed the resident?s history and exam and pertinent patient test results.  I agree with the assessment, diagnosis, and plan of care documented in the resident?s note.  ?

## 2021-05-16 NOTE — Chronic Care Management (AMB) (Signed)
  Care Management   Outreach Note  05/16/2021 Name: Juan Patterson MRN: [id number] DOB: 10/04/1990  Referred by: Patient, Pcp Ellison Carwin, MD Reason for referral : Care Coordination (Initial outreach to schedule referral with BSW )   A second unsuccessful telephone outreach was attempted today. The patient was referred to the case management team for assistance with care management and care coordination.   Follow Up Plan:  A HIPAA compliant phone message was left for the patient providing contact information and requesting a return call.  The care management team will reach out to the patient again over the next 7 days.  If patient returns call to provider office, please advise to call Embedded Care Management Care Guide Juan Patterson* at [phone].Gwenevere Ghazi  Care Guide, Embedded Care Coordination [facility] Management  Direct Dial: [phone]

## 2021-05-18 NOTE — Chronic Care Management (AMB) (Signed)
  Care Management   Outreach Note  05/18/2021 Name: Juan Patterson MRN: [id number] DOB: [birth year]  Referred by: Patient, Pcp Ellison Carwin, MD Reason for referral : Care Coordination (Initial outreach to schedule referral with BSW )   Third unsuccessful telephone outreach was attempted today. The patient was referred to the case management team for assistance with care management and care coordination. The patient's primary care provider has been notified of our unsuccessful attempts to make or maintain contact with the patient. The care management team is pleased to engage with this patient at any time in the future should he/she be interested in assistance from the care management team.   Follow Up Plan:  We have been unable to make contact with the patient for follow up. The care management team is available to follow up with the patient after provider conversation with the patient regarding recommendation for care management engagement and subsequent re-referral to the care management team. A HIPAA compliant phone message was left for the patient providing contact information and requesting a return call.    Gwenevere Ghazi  Care Guide, Embedded Care Coordination [facility] Management  Direct Dial: [phone]

## 2021-06-27 ENCOUNTER — Emergency Department (HOSPITAL_BASED_OUTPATIENT_CLINIC_OR_DEPARTMENT_OTHER)
Admission: EM | Admit: 2021-06-27 | Discharge: 2021-06-28 | Disposition: A | Discharge status: Home / Self Care | Payer: Self-pay | Attending: Emergency Medicine | Admitting: Emergency Medicine

## 2021-06-27 ENCOUNTER — Encounter (HOSPITAL_BASED_OUTPATIENT_CLINIC_OR_DEPARTMENT_OTHER): Payer: Self-pay | Admitting: Urology

## 2021-06-27 ENCOUNTER — Other Ambulatory Visit: Payer: Self-pay

## 2021-06-27 DIAGNOSIS — K610 Anal abscess: Secondary | ICD-10-CM | POA: Insufficient documentation

## 2021-06-27 DIAGNOSIS — R102 Pelvic and perineal pain: Secondary | ICD-10-CM | POA: Insufficient documentation

## 2021-06-27 MED ORDER — SODIUM CHLORIDE 0.9 % IV BOLUS
1000.0000 mL | Freq: Once | INTRAVENOUS | Status: AC
  Administered 2021-06-27: 1000 mL via INTRAVENOUS

## 2021-06-27 MED ORDER — MORPHINE SULFATE (PF) 4 MG/ML IV SOLN
4.0000 mg | Freq: Once | INTRAVENOUS | Status: AC
  Administered 2021-06-27: 4 mg via INTRAVENOUS
  Filled 2021-06-27: qty 1

## 2021-06-27 MED ORDER — ONDANSETRON HCL 4 MG/2ML IJ SOLN
4.0000 mg | Freq: Once | INTRAMUSCULAR | Status: AC
  Administered 2021-06-27: 4 mg via INTRAVENOUS
  Filled 2021-06-27: qty 2

## 2021-06-27 NOTE — ED Provider Notes (Signed)
[facility] EMERGENCY DEPARTMENT Provider Note   CSN: [id number] Arrival date & time: 06/27/21  2301     History  Chief Complaint  Patient presents with   Abscess    Juan Patterson is a 31 y.o. male.  Patient presents to the emergency department for evaluation of abscess.  Patient reports that he has been experiencing pain and swelling of the left buttock and perineal area for a couple of weeks.  It started draining several days ago, drained some pus and blood but this has stopped, pain is persistent.       Home Medications Prior to Admission medications   Medication Sig Start Date End Date Taking? Authorizing Provider  HYDROcodone-acetaminophen (NORCO/VICODIN) 5-325 MG tablet Take 1 tablet by mouth every 4 (four) hours as needed for moderate pain. 06/28/21  Yes Gyasi Hazzard, Gwenyth Allegra, MD  metroNIDAZOLE (FLAGYL) 500 MG tablet Take 1 tablet (500 mg total) by mouth 3 (three) times daily. 06/28/21  Yes Havish Petties, Gwenyth Allegra, MD  sulfamethoxazole-trimethoprim (BACTRIM DS) 800-160 MG tablet Take 1 tablet by mouth 2 (two) times daily. 06/28/21  Yes Maysel Mccolm, Gwenyth Allegra, MD      Allergies    Patient has no known allergies.    Review of Systems   Review of Systems  Physical Exam Updated Vital Signs BP 118/78   Pulse 65   Temp 98.7 F (37.1 C) (Oral)   Resp 16   Ht 6\' 4"  (1.93 m)   Wt 95.3 kg   SpO2 97%   BMI 25.56 kg/m  Physical Exam Vitals and nursing note reviewed.  Constitutional:      General: He is not in acute distress.    Appearance: He is well-developed.  HENT:     Head: Normocephalic and atraumatic.     Mouth/Throat:     Mouth: Mucous membranes are moist.  Eyes:     General: Vision grossly intact. Gaze aligned appropriately.     Extraocular Movements: Extraocular movements intact.     Conjunctiva/sclera: Conjunctivae normal.  Cardiovascular:     Rate and Rhythm: Normal rate and regular rhythm.     Pulses: Normal pulses.     Heart sounds: Normal  heart sounds, S1 normal and S2 normal. No murmur heard.    No friction rub. No gallop.  Pulmonary:     Effort: Pulmonary effort is normal. No respiratory distress.     Breath sounds: Normal breath sounds.  Abdominal:     Palpations: Abdomen is soft.     Tenderness: There is no abdominal tenderness. There is no guarding or rebound.     Hernia: No hernia is present.  Genitourinary:   Musculoskeletal:        General: No swelling.     Cervical back: Full passive range of motion without pain, normal range of motion and neck supple. No pain with movement, spinous process tenderness or muscular tenderness. Normal range of motion.     Right lower leg: No edema.     Left lower leg: No edema.  Skin:    General: Skin is warm and dry.     Capillary Refill: Capillary refill takes less than 2 seconds.     Findings: No ecchymosis, erythema, lesion or wound.  Neurological:     Mental Status: He is alert and oriented to person, place, and time.     GCS: GCS eye subscore is 4. GCS verbal subscore is 5. GCS motor subscore is 6.     Cranial Nerves: Cranial nerves 2-12  are intact.     Sensory: Sensation is intact.     Motor: Motor function is intact. No weakness or abnormal muscle tone.     Coordination: Coordination is intact.  Psychiatric:        Mood and Affect: Mood normal.        Speech: Speech normal.        Behavior: Behavior normal.     ED Results / Procedures / Treatments   Labs (all labs ordered are listed, but only abnormal results are displayed) Labs Reviewed  CBC WITH DIFFERENTIAL/PLATELET  BASIC METABOLIC PANEL  LACTIC ACID, PLASMA    EKG None  Radiology CT PELVIS W CONTRAST  Result Date: 06/28/2021 CLINICAL DATA:  Anorectal abscess. Patient reports wound in groin area between buttocks in testicles for 2 weeks. EXAM: CT PELVIS WITH CONTRAST TECHNIQUE: Multidetector CT imaging of the pelvis was performed using the standard protocol following the bolus administration of  intravenous contrast. RADIATION DOSE REDUCTION: This exam was performed according to the departmental dose-optimization program which includes automated exposure control, adjustment of the mA and/or kV according to patient size and/or use of iterative reconstruction technique. CONTRAST:  OMNIPAQUE IOHEXOL 300 MG/ML  SOLN COMPARISON:  Abdominopelvic CT 05/01/2021 FINDINGS: Urinary Tract: Partially distended urinary bladder, normal for degree of distension. Bowel: No inflammation of pelvic bowel loops. No abnormal bowel distension or obstruction. The appendix is not definitively seen on the current exam. Vascular/Lymphatic: Prominent left external iliac nodes measuring up to 9 mm, similar to prior and likely reactive. Mildly enlarged 11 mm right external iliac node, also present on prior. The previous left external iliac node has diminished in size currently 9 mm, series 2, image 66, previously 11 mm. No new or progressive pelvic adenopathy aortic atherosclerosis. Reproductive:  Unremarkable prostate. Other: Thick walled fluid collection measuring 3.1 x 1.6 x 3.4 cm, series 2, image 130 and series 7, image 42 in the left perineum abutting the gluteal crease. There is adjacent fat stranding and inflammation. No internal or soft tissue gas. No definite fistulous connection to the anorectum on the current exam. No intrapelvic collection or pelvic free fluid. Musculoskeletal: There are no acute or suspicious osseous abnormalities. IMPRESSION: 1. Thick walled fluid collection in the left perineum abutting the gluteal crease measuring 3.1 x 1.6 x 3.4 cm consistent with abscess. No internal or soft tissue gas. No definite fistulous connection to the anorectum on the current exam. 2. Prominent pelvic lymph nodes are likely reactive, some of which are smaller than on prior CT. Electronically Signed   By: Narda Rutherford M.D.   On: 06/28/2021 00:56    Procedures .Marland KitchenIncision and Drainage  Date/Time: 06/28/2021 2:07  AM  Performed by: Gilda Crease, MD Authorized by: Gilda Crease, MD   Consent:    Consent obtained:  Verbal   Consent given by:  Patient   Risks, benefits, and alternatives were discussed: yes     Risks discussed:  Bleeding, incomplete drainage and pain Universal protocol:    Procedure explained and questions answered to patient or proxy's satisfaction: yes     Relevant documents present and verified: yes     Test results available : yes     Imaging studies available: yes     Required blood products, implants, devices, and special equipment available: yes     Site/side marked: yes     Immediately prior to procedure, a time out was called: yes     Patient identity confirmed:  Verbally  with patient Location:    Type:  Abscess   Location:  Anogenital   Anogenital location:  Perianal Pre-procedure details:    Skin preparation:  Povidone-iodine Sedation:    Sedation type:  None Anesthesia:    Anesthesia method:  Topical application and local infiltration   Topical anesthetic:  LET   Local anesthetic:  Lidocaine 2% w/o epi Procedure type:    Complexity:  Simple Procedure details:    Ultrasound guidance: no     Needle aspiration: no     Incision types:  Single straight   Incision depth:  Subcutaneous   Wound management:  Probed and deloculated and irrigated with saline   Drainage:  Purulent   Drainage amount:  Moderate   Wound treatment:  Wound left open   Packing materials:  None Post-procedure details:    Procedure completion:  Tolerated well, no immediate complications     Medications Ordered in ED Medications  sulfamethoxazole-trimethoprim (BACTRIM DS) 800-160 MG per tablet 1 tablet (has no administration in time range)  metroNIDAZOLE (FLAGYL) tablet 500 mg (has no administration in time range)  sodium chloride 0.9 % bolus 1,000 mL (0 mLs Intravenous Stopped 06/28/21 0107)  morphine (PF) 4 MG/ML injection 4 mg (4 mg Intravenous Given 06/27/21 2350)   ondansetron (ZOFRAN) injection 4 mg (4 mg Intravenous Given 06/27/21 2349)  iohexol (OMNIPAQUE) 300 MG/ML solution 100 mL (100 mLs Intravenous Contrast Given 06/28/21 0030)  lidocaine (XYLOCAINE) 2 % (with pres) injection 200 mg (200 mg Infiltration Given 06/28/21 0132)  lidocaine-EPINEPHrine-tetracaine (LET) topical gel (3 mLs Topical Given 06/28/21 0132)    ED Course/ Medical Decision Making/ A&P                           Medical Decision Making Amount and/or Complexity of Data Reviewed Labs: ordered. Radiology: ordered.  Risk Prescription drug management.   Patient presents to the emergency department for evaluation of pain and swelling of the left buttock concerning for infection and abscess.  Patient reports spontaneous drainage a couple of days ago.  Examination reveals a pinpoint area where the previous drainage came from but he still has some fluctuance.  Lab work is reassuring.  CT of pelvis shows localized fluid collection without extension into the deep pelvis or fistular tracts.  Bedside drainage was performed.  Patient will be discharged with analgesia, antibiotic coverage.  Given return precautions.        Final Clinical Impression(s) / ED Diagnoses Final diagnoses:  Perianal abscess    Rx / DC Orders ED Discharge Orders          Ordered    HYDROcodone-acetaminophen (NORCO/VICODIN) 5-325 MG tablet  Every 4 hours PRN        06/28/21 0210    sulfamethoxazole-trimethoprim (BACTRIM DS) 800-160 MG tablet  2 times daily        06/28/21 0210    metroNIDAZOLE (FLAGYL) 500 MG tablet  3 times daily        06/28/21 0210              Gilda Crease, MD 06/28/21 [phone]

## 2021-06-28 ENCOUNTER — Emergency Department (HOSPITAL_BASED_OUTPATIENT_CLINIC_OR_DEPARTMENT_OTHER): Payer: Self-pay

## 2021-06-28 LAB — CBC WITH DIFFERENTIAL/PLATELET
Abs Immature Granulocytes: 0.04 10*3/uL (ref 0.00–0.07)
Basophils Absolute: 0.1 10*3/uL (ref 0.0–0.1)
Basophils Relative: 1 %
Eosinophils Absolute: 0.1 10*3/uL (ref 0.0–0.5)
Eosinophils Relative: 1 %
HCT: 39.3 % (ref 39.0–52.0)
Hemoglobin: 13.2 g/dL (ref 13.0–17.0)
Immature Granulocytes: 1 %
Lymphocytes Relative: 36 %
Lymphs Abs: 3.1 10*3/uL (ref 0.7–4.0)
MCH: 28.8 pg (ref 26.0–34.0)
MCHC: 33.6 g/dL (ref 30.0–36.0)
MCV: 85.6 fL (ref 80.0–100.0)
Monocytes Absolute: 0.6 10*3/uL (ref 0.1–1.0)
Monocytes Relative: 7 %
Neutro Abs: 4.7 10*3/uL (ref 1.7–7.7)
Neutrophils Relative %: 54 %
Platelets: 321 10*3/uL (ref 150–400)
RBC: 4.59 MIL/uL (ref 4.22–5.81)
RDW: 14.2 % (ref 11.5–15.5)
WBC: 8.6 10*3/uL (ref 4.0–10.5)
nRBC: 0 % (ref 0.0–0.2)

## 2021-06-28 LAB — BASIC METABOLIC PANEL
Anion gap: 7 (ref 5–15)
BUN: 13 mg/dL (ref 6–20)
CO2: 27 mmol/L (ref 22–32)
Calcium: 9 mg/dL (ref 8.9–10.3)
Chloride: 104 mmol/L (ref 98–111)
Creatinine, Ser: 1.02 mg/dL (ref 0.61–1.24)
GFR, Estimated: >60 mL/min (ref >60)
Glucose, Bld: 96 mg/dL (ref 70–99)
Potassium: 3.6 mmol/L (ref 3.5–5.1)
Sodium: 138 mmol/L (ref 135–145)

## 2021-06-28 LAB — LACTIC ACID, PLASMA: Lactic Acid, Venous: 1 mmol/L (ref 0.5–1.9)

## 2021-06-28 MED ORDER — HYDROCODONE-ACETAMINOPHEN 5-325 MG PO TABS: 1.0000 | ORAL_TABLET | ORAL | 0 refills | Status: DC | PRN

## 2021-06-28 MED ORDER — IOHEXOL 300 MG/ML  SOLN
100.0000 mL | Freq: Once | INTRAMUSCULAR | Status: AC | PRN
  Administered 2021-06-28: 100 mL via INTRAVENOUS

## 2021-06-28 MED ORDER — SULFAMETHOXAZOLE-TRIMETHOPRIM 800-160 MG PO TABS: 1.0000 | ORAL_TABLET | Freq: Two times a day (BID) | ORAL | 0 refills | Status: DC

## 2021-06-28 MED ORDER — LIDOCAINE HCL 2 % IJ SOLN
10.0000 mL | Freq: Once | INTRAMUSCULAR | Status: AC
Start: 2021-06-28 — End: 2021-06-28
  Administered 2021-06-28: 200 mg
  Filled 2021-06-28: qty 20

## 2021-06-28 MED ORDER — METRONIDAZOLE 500 MG PO TABS: 500.0000 mg | ORAL_TABLET | Freq: Three times a day (TID) | ORAL | 0 refills | Status: DC

## 2021-06-28 MED ORDER — METRONIDAZOLE 500 MG PO TABS
500.0000 mg | ORAL_TABLET | Freq: Once | ORAL | Status: AC
Start: 2021-06-28 — End: 2021-06-28
  Administered 2021-06-28: 500 mg via ORAL
  Filled 2021-06-28: qty 1

## 2021-06-28 MED ORDER — LIDOCAINE-EPINEPHRINE-TETRACAINE (LET) TOPICAL GEL
3.0000 mL | Freq: Once | TOPICAL | Status: AC
  Administered 2021-06-28: 3 mL via TOPICAL
  Filled 2021-06-28: qty 3

## 2021-06-28 MED ORDER — SULFAMETHOXAZOLE-TRIMETHOPRIM 800-160 MG PO TABS
1.0000 | ORAL_TABLET | Freq: Once | ORAL | Status: AC
  Administered 2021-06-28: 1 via ORAL
  Filled 2021-06-28: qty 1

## 2021-06-28 NOTE — Discharge Instructions (Addendum)
For the next 2 days, soak in a warm tub at least twice a day to keep the area clean and draining.

## 2022-01-05 ENCOUNTER — Emergency Department (HOSPITAL_BASED_OUTPATIENT_CLINIC_OR_DEPARTMENT_OTHER)
Admission: EM | Admit: 2022-01-05 | Discharge: 2022-01-05 | Disposition: A | Discharge status: Home / Self Care | Payer: Self-pay | Attending: Emergency Medicine | Admitting: Emergency Medicine

## 2022-01-05 ENCOUNTER — Encounter (HOSPITAL_BASED_OUTPATIENT_CLINIC_OR_DEPARTMENT_OTHER): Payer: Self-pay | Admitting: Emergency Medicine

## 2022-01-05 ENCOUNTER — Emergency Department (HOSPITAL_BASED_OUTPATIENT_CLINIC_OR_DEPARTMENT_OTHER): Payer: Self-pay

## 2022-01-05 ENCOUNTER — Other Ambulatory Visit: Payer: Self-pay

## 2022-01-05 DIAGNOSIS — B338 Other specified viral diseases: Secondary | ICD-10-CM | POA: Insufficient documentation

## 2022-01-05 DIAGNOSIS — Z1152 Encounter for screening for COVID-19: Secondary | ICD-10-CM | POA: Insufficient documentation

## 2022-01-05 DIAGNOSIS — K5900 Constipation, unspecified: Secondary | ICD-10-CM | POA: Insufficient documentation

## 2022-01-05 DIAGNOSIS — J111 Influenza due to unidentified influenza virus with other respiratory manifestations: Secondary | ICD-10-CM | POA: Insufficient documentation

## 2022-01-05 LAB — CBC
HCT: 42.4 % (ref 39.0–52.0)
Hemoglobin: 14.4 g/dL (ref 13.0–17.0)
MCH: 28.3 pg (ref 26.0–34.0)
MCHC: 34 g/dL (ref 30.0–36.0)
MCV: 83.5 fL (ref 80.0–100.0)
Platelets: 196 10*3/uL (ref 150–400)
RBC: 5.08 MIL/uL (ref 4.22–5.81)
RDW: 13.9 % (ref 11.5–15.5)
WBC: 5.1 10*3/uL (ref 4.0–10.5)
nRBC: 0 % (ref 0.0–0.2)

## 2022-01-05 LAB — BASIC METABOLIC PANEL
Anion gap: 12 (ref 5–15)
BUN: 16 mg/dL (ref 6–20)
CO2: 20 mmol/L — ABNORMAL LOW (ref 22–32)
Calcium: 8.8 mg/dL — ABNORMAL LOW (ref 8.9–10.3)
Chloride: 100 mmol/L (ref 98–111)
Creatinine, Ser: 1.13 mg/dL (ref 0.61–1.24)
GFR, Estimated: >60 mL/min (ref >60)
Glucose, Bld: 101 mg/dL — ABNORMAL HIGH (ref 70–99)
Potassium: 3.7 mmol/L (ref 3.5–5.1)
Sodium: 132 mmol/L — ABNORMAL LOW (ref 135–145)

## 2022-01-05 LAB — TROPONIN I (HIGH SENSITIVITY): Troponin I (High Sensitivity): 4 ng/L (ref <18)

## 2022-01-05 LAB — RESP PANEL BY RT-PCR (RSV, FLU A&B, COVID)  RVPGX2
Influenza A by PCR: POSITIVE — AB
Influenza B by PCR: NEGATIVE
Resp Syncytial Virus by PCR: POSITIVE — AB
SARS Coronavirus 2 by RT PCR: NEGATIVE

## 2022-01-05 LAB — OCCULT BLOOD X 1 CARD TO LAB, STOOL: Fecal Occult Bld: POSITIVE — AB

## 2022-01-05 MED ORDER — ACETAMINOPHEN 325 MG PO TABS
650.0000 mg | ORAL_TABLET | Freq: Once | ORAL | Status: AC | PRN
  Administered 2022-01-05: 650 mg via ORAL
  Filled 2022-01-05: qty 2

## 2022-01-05 NOTE — ED Notes (Signed)
Pt verbalized understanding of discharge instructions. Opportunity for questions provided.  
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
## 2022-01-05 NOTE — Discharge Instructions (Addendum)
Please follow up with your primary care doctor within 2-3 days. For constipation we also recommend a diet high in fiber (beans, fruits, vegetables, whole grains). Take Colace 100-200 mg up to three times per day. You may take along with Senokot 1-2 tabs, ingest with full glass of water.  You may also take MiraLAX 1-2 capfuls 1-2 times a day until stools become soft and then slowly decrease the amount of MiraLAX used.  Maintain fluid intake 6-8 glasses per day. Please increase fibers in your diet. You may also take Milk of Magnesia 30 mL as needed for constipation, you may repeat in 2 hours again if no bowl movement.  

## 2022-01-05 NOTE — ED Triage Notes (Signed)
Pt arrives pov, steady gait, c/o HA, cough, fever, shob x 3 days.

## 2022-01-05 NOTE — ED Notes (Signed)
ED Provider at bedside. 

## 2022-01-05 NOTE — ED Notes (Signed)
Pt requests when drawing labs to have rectal exam d/t concern for hemorrhoids

## 2022-01-07 ENCOUNTER — Other Ambulatory Visit: Payer: Self-pay

## 2022-01-07 ENCOUNTER — Encounter (HOSPITAL_BASED_OUTPATIENT_CLINIC_OR_DEPARTMENT_OTHER): Payer: Self-pay | Admitting: Emergency Medicine

## 2022-01-07 ENCOUNTER — Encounter (HOSPITAL_COMMUNITY): Payer: Self-pay

## 2022-01-07 ENCOUNTER — Emergency Department (HOSPITAL_BASED_OUTPATIENT_CLINIC_OR_DEPARTMENT_OTHER): Payer: Self-pay

## 2022-01-07 ENCOUNTER — Inpatient Hospital Stay (HOSPITAL_BASED_OUTPATIENT_CLINIC_OR_DEPARTMENT_OTHER)
Admission: EM | Admit: 2022-01-07 | Discharge: 2022-01-08 | DRG: 395 | Disposition: A | Discharge status: Home / Self Care | Payer: Self-pay | Attending: Internal Medicine | Admitting: Internal Medicine

## 2022-01-07 DIAGNOSIS — J121 Respiratory syncytial virus pneumonia: Secondary | ICD-10-CM | POA: Insufficient documentation

## 2022-01-07 DIAGNOSIS — Z720 Tobacco use: Secondary | ICD-10-CM | POA: Insufficient documentation

## 2022-01-07 DIAGNOSIS — F1210 Cannabis abuse, uncomplicated: Secondary | ICD-10-CM | POA: Insufficient documentation

## 2022-01-07 DIAGNOSIS — K6289 Other specified diseases of anus and rectum: Principal | ICD-10-CM

## 2022-01-07 DIAGNOSIS — I700 Atherosclerosis of aorta: Secondary | ICD-10-CM | POA: Diagnosis present

## 2022-01-07 DIAGNOSIS — B974 Respiratory syncytial virus as the cause of diseases classified elsewhere: Secondary | ICD-10-CM | POA: Diagnosis present

## 2022-01-07 DIAGNOSIS — R110 Nausea: Secondary | ICD-10-CM | POA: Diagnosis present

## 2022-01-07 DIAGNOSIS — J069 Acute upper respiratory infection, unspecified: Secondary | ICD-10-CM | POA: Diagnosis present

## 2022-01-07 DIAGNOSIS — F17210 Nicotine dependence, cigarettes, uncomplicated: Secondary | ICD-10-CM | POA: Diagnosis present

## 2022-01-07 DIAGNOSIS — K611 Rectal abscess: Principal | ICD-10-CM | POA: Diagnosis present

## 2022-01-07 DIAGNOSIS — J101 Influenza due to other identified influenza virus with other respiratory manifestations: Secondary | ICD-10-CM | POA: Insufficient documentation

## 2022-01-07 DIAGNOSIS — R197 Diarrhea, unspecified: Secondary | ICD-10-CM | POA: Diagnosis present

## 2022-01-07 DIAGNOSIS — G8929 Other chronic pain: Secondary | ICD-10-CM | POA: Diagnosis present

## 2022-01-07 LAB — COMPREHENSIVE METABOLIC PANEL
ALT: 11 U/L (ref 0–44)
AST: 30 U/L (ref 15–41)
Albumin: 4.1 g/dL (ref 3.5–5.0)
Alkaline Phosphatase: 41 U/L (ref 38–126)
Anion gap: 15 (ref 5–15)
BUN: 15 mg/dL (ref 6–20)
CO2: 19 mmol/L — ABNORMAL LOW (ref 22–32)
Calcium: 9 mg/dL (ref 8.9–10.3)
Chloride: 97 mmol/L — ABNORMAL LOW (ref 98–111)
Creatinine, Ser: 1.07 mg/dL (ref 0.61–1.24)
GFR, Estimated: >60 mL/min (ref >60)
Glucose, Bld: 99 mg/dL (ref 70–99)
Potassium: 4.2 mmol/L (ref 3.5–5.1)
Sodium: 131 mmol/L — ABNORMAL LOW (ref 135–145)
Total Bilirubin: 1.2 mg/dL (ref 0.3–1.2)
Total Protein: 8 g/dL (ref 6.5–8.1)

## 2022-01-07 LAB — CBC WITH DIFFERENTIAL/PLATELET
Abs Immature Granulocytes: 0.02 10*3/uL (ref 0.00–0.07)
Basophils Absolute: 0 10*3/uL (ref 0.0–0.1)
Basophils Relative: 0 %
Eosinophils Absolute: 0 10*3/uL (ref 0.0–0.5)
Eosinophils Relative: 0 %
HCT: 45 % (ref 39.0–52.0)
Hemoglobin: 15.8 g/dL (ref 13.0–17.0)
Immature Granulocytes: 0 %
Lymphocytes Relative: 21 %
Lymphs Abs: 1.4 10*3/uL (ref 0.7–4.0)
MCH: 28.8 pg (ref 26.0–34.0)
MCHC: 35.1 g/dL (ref 30.0–36.0)
MCV: 82.1 fL (ref 80.0–100.0)
Monocytes Absolute: 0.6 10*3/uL (ref 0.1–1.0)
Monocytes Relative: 9 %
Neutro Abs: 4.7 10*3/uL (ref 1.7–7.7)
Neutrophils Relative %: 70 %
Platelets: 189 10*3/uL (ref 150–400)
RBC: 5.48 MIL/uL (ref 4.22–5.81)
RDW: 13.5 % (ref 11.5–15.5)
WBC: 6.7 10*3/uL (ref 4.0–10.5)
nRBC: 0 % (ref 0.0–0.2)

## 2022-01-07 LAB — HIV ANTIBODY (ROUTINE TESTING W REFLEX): HIV Screen 4th Generation wRfx: NONREACTIVE

## 2022-01-07 MED ORDER — SODIUM CHLORIDE 0.9 % IV SOLN: INTRAVENOUS | Status: DC | PRN

## 2022-01-07 MED ORDER — PIPERACILLIN-TAZOBACTAM 3.375 G IVPB 30 MIN
3.3750 g | Freq: Once | INTRAVENOUS | Status: AC
  Administered 2022-01-07: 3.375 g via INTRAVENOUS
  Filled 2022-01-07: qty 50

## 2022-01-07 MED ORDER — FENTANYL CITRATE PF 50 MCG/ML IJ SOSY
25.0000 ug | PREFILLED_SYRINGE | INTRAMUSCULAR | Status: DC | PRN
  Administered 2022-01-07 (×3): 25 ug via INTRAVENOUS
  Filled 2022-01-07 (×3): qty 1

## 2022-01-07 MED ORDER — SODIUM CHLORIDE 0.9 % IV BOLUS
1000.0000 mL | Freq: Once | INTRAVENOUS | Status: AC
  Administered 2022-01-07: 1000 mL via INTRAVENOUS

## 2022-01-07 MED ORDER — PIPERACILLIN-TAZOBACTAM 3.375 G IVPB: 3.3750 g | Freq: Three times a day (TID) | INTRAVENOUS | Status: DC

## 2022-01-07 MED ORDER — PIPERACILLIN-TAZOBACTAM 3.375 G IVPB
3.3750 g | Freq: Three times a day (TID) | INTRAVENOUS | Status: DC
  Administered 2022-01-07 – 2022-01-08 (×3): 3.375 g via INTRAVENOUS
  Filled 2022-01-07 (×3): qty 50

## 2022-01-07 MED ORDER — ALBUTEROL SULFATE (2.5 MG/3ML) 0.083% IN NEBU: 2.5000 mg | INHALATION_SOLUTION | RESPIRATORY_TRACT | Status: DC | PRN

## 2022-01-07 MED ORDER — ACETAMINOPHEN 325 MG PO TABS
650.0000 mg | ORAL_TABLET | Freq: Once | ORAL | Status: AC
  Administered 2022-01-07: 650 mg via ORAL
  Filled 2022-01-07: qty 2

## 2022-01-07 MED ORDER — PROCHLORPERAZINE EDISYLATE 10 MG/2ML IJ SOLN: 10.0000 mg | Freq: Four times a day (QID) | INTRAMUSCULAR | Status: DC | PRN

## 2022-01-07 MED ORDER — FENTANYL CITRATE PF 50 MCG/ML IJ SOSY
50.0000 ug | PREFILLED_SYRINGE | Freq: Once | INTRAMUSCULAR | Status: AC
  Administered 2022-01-07: 50 ug via INTRAVENOUS
  Filled 2022-01-07: qty 1

## 2022-01-07 MED ORDER — SODIUM CHLORIDE 0.9 % IV SOLN: INTRAVENOUS | Status: DC

## 2022-01-07 MED ORDER — IOHEXOL 300 MG/ML  SOLN
80.0000 mL | Freq: Once | INTRAMUSCULAR | Status: AC | PRN
  Administered 2022-01-07: 80 mL via INTRAVENOUS

## 2022-01-07 MED ORDER — ACETAMINOPHEN 325 MG PO TABS: 650.0000 mg | ORAL_TABLET | Freq: Four times a day (QID) | ORAL | Status: DC | PRN

## 2022-01-07 NOTE — Progress Notes (Signed)
Pharmacy Antibiotic Note  Juan Patterson is a 32 y.o. male admitted on 01/07/2022 with  intra-abdominal infection .  Pharmacy has been consulted for Zosyn dosing. SCr 1.07 on presentation.  Plan: Zosyn 3.375g IV (18min infusion) x1; then 3.375g IV q8h (4h infusion) Monitor clinical progress, c/s, renal function F/u de-escalation plan/LOT   Height: 6\' 5"  (195.6 cm) Weight: 90.7 kg (200 lb) IBW/kg (Calculated) : 89.1  Temp (24hrs), Avg:100 F (37.8 C), Min:99.5 F (37.5 C), Max:100.4 F (38 C)  Recent Labs  Lab 01/05/22 0931 01/07/22 0614  WBC 5.1 6.7  CREATININE 1.13 1.07    Estimated Creatinine Clearance: 124.9 mL/min (by C-G formula based on SCr of 1.07 mg/dL).    No Known Allergies  Arturo Morton, PharmD, BCPS Please check AMION for all [facility] contact numbers Clinical Pharmacist 01/07/2022 8:10 AM

## 2022-01-07 NOTE — ED Triage Notes (Signed)
Pt states he was seen here 01/05/22. He states since then he has gotten worse. Reports middle and low back pain, rectal pain and bleeding from rectum. He states he feels dehydrated. Has been taking vitamin c, theraflu, muscle relaxers, and hemorrhoid cream. Pt reports abd pain from umbilical area "down to pelvis". Denies n/v/d. Does report some constipation and SHOB. He states his [facility] is worse because his pain is worse.

## 2022-01-07 NOTE — ED Notes (Signed)
[facility] at bedside 

## 2022-01-07 NOTE — Progress Notes (Signed)
General Surgery paged made aware that patient is present and awaiting consult.

## 2022-01-07 NOTE — Progress Notes (Signed)
Plan of Care Note for accepted transfer   Patient: Juan Patterson MRN: [id number]   DOA: 01/07/2022  Facility requesting transfer: [facility] Requesting Provider: Dr. Armandina Gemma Reason for transfer: perirectal abscess Facility course: 32 yo M w/ no significant PMHx. Presenting with ractal pain. W/u revealed perirectal abscess on CT. He was started on fluids, abx, pain control. EDP spoke with general surgery (Dr. Marlou Starks). They will follow.  Plan of care: The patient is accepted for admission to [facility]  unit, at [facility]  While holding at [facility], medical decision making responsibilities remain with the EDP. Upon arrival to [facility], [facility] will assume care. Thank you.  Author: Jonnie Finner, DO 01/07/2022  Check [email or web address] for on-call coverage.  Nursing staff, Please call Reader number on Amion as soon as patient's arrival, so appropriate admitting provider can evaluate the pt.

## 2022-01-07 NOTE — ED Provider Notes (Signed)
  Physical Exam  BP (!) 141/87   Pulse 70   Temp (!) 100.4 F (38 C) (Oral)   Resp 15   Ht 6\' 5"  (1.956 m)   Wt 90.7 kg   SpO2 100%   BMI 23.72 kg/m     Procedures  Procedures  ED Course / MDM    Medical Decision Making Amount and/or Complexity of Data Reviewed Labs: ordered. Radiology: ordered.  Risk OTC drugs. Prescription drug management. Decision regarding hospitalization.   56M, recently dual positive for flu and RSV, presenting with recurrent perirectal pain. Tender over the perineum, hx of gluteal abscess. CT Imaging pending to look for perirectal abscess. Pt refused a rectal exam, no external hemorrhoids present. If CT reassuring, treat for prostatitis.   CT Abdomen Pelvis W revealed the following: IMPRESSION:  1. Anatomy of the distal rectum/anus is not well demonstrated  although there is a crescentic rim enhancing fluid collection at the  low rectum/anal region. This may well simply represent fluid/stool  in a nondistended low rectum. Given the patient's history of rectal  pain, perirectal abscess would also be a consideration although  there is not a substantial amount of perirectal edema/inflammation  as would be expected in that setting. If clinical picture is  suspicious for perirectal abscess, pelvic MRI with and without  contrast may prove helpful to further evaluate.  2. Right and transverse colon are diffusely distended with gas and  fluid. Left colon is decompressed with no discrete transition zone  or obstructing lesion evident.  3. Trace free fluid in the pelvis.  4.  Aortic Atherosclerosis (ICD10-I70.0).   General surgery consulted for further recommendations. Spoke with Dr. Marlou Starks, recommended admission to medicine, IV ABX, and they will consult inpatient. Spoke with Dr. Marylyn Ishihara who will admit the patient.      Regan Lemming, MD 01/07/22 [phone]
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
## 2022-01-07 NOTE — ED Provider Notes (Signed)
[facility] EMERGENCY DEPARTMENT Provider Note   CSN: [id number] Arrival date & time: 01/07/22  0516     History  Chief Complaint  Patient presents with   Follow-up    Juan Patterson is a 32 y.o. male.  The history is provided by the patient and medical records.  Juan Patterson is a 32 y.o. male who presents to the Emergency Department complaining of pain.  He comes to the emergency department complaining of back, rectal and abdominal pain that started 2 days ago.  Pain is constant and worse with movement.  He also reports fever since Tuesday with associated cough and shortness of breath.  He has nausea but no vomiting.  He does report associated constipation.  His last BM was Friday and he did have to strain to have the BM.  He reports issues with constipation since May.  He was recently seen in the emergency department for similar symptoms and tested positive for influenza A as well as RSV. Back, butt, abd 2 days ago. Constant.  Worse with movement    Home Medications Prior to Admission medications   Medication Sig Start Date End Date Taking? Authorizing Provider  HYDROcodone-acetaminophen (NORCO/VICODIN) 5-325 MG tablet Take 1 tablet by mouth every 4 (four) hours as needed for moderate pain. 06/28/21   Gilda Crease, MD  metroNIDAZOLE (FLAGYL) 500 MG tablet Take 1 tablet (500 mg total) by mouth 3 (three) times daily. 06/28/21   Gilda Crease, MD  sulfamethoxazole-trimethoprim (BACTRIM DS) 800-160 MG tablet Take 1 tablet by mouth 2 (two) times daily. 06/28/21   Gilda Crease, MD      Allergies    Patient has no known allergies.    Review of Systems   Review of Systems  All other systems reviewed and are negative.   Physical Exam Updated Vital Signs BP (!) 141/87   Pulse 70   Temp (!) 100.4 F (38 C) (Oral)   Resp 15   Ht 6\' 5"  (1.956 m)   Wt 90.7 kg   SpO2 100%   BMI 23.72 kg/m  Physical Exam Vitals and nursing note reviewed.   Constitutional:      Appearance: He is well-developed.  HENT:     Head: Normocephalic and atraumatic.  Cardiovascular:     Rate and Rhythm: Normal rate and regular rhythm.     Heart sounds: No murmur heard. Pulmonary:     Effort: Pulmonary effort is normal. No respiratory distress.     Breath sounds: Normal breath sounds.  Abdominal:     Palpations: Abdomen is soft.     Tenderness: There is no abdominal tenderness. There is no guarding or rebound.  Musculoskeletal:        General: No tenderness.     Comments: Patient declines DRE but does allow external rectal exam.  There are no external hemorrhoids.  There is a clean and nontender wound on the gluteal region without local erythema or tenderness.  There is tenderness to palpation over the perineum without local fullness.  There is mild right perirectal fullness without palpable fluid collection.  Skin:    General: Skin is warm and dry.  Neurological:     Mental Status: He is alert and oriented to person, place, and time.  Psychiatric:        Behavior: Behavior normal.     ED Results / Procedures / Treatments   Labs (all labs ordered are listed, but only abnormal results are displayed) Labs Reviewed  COMPREHENSIVE  METABOLIC PANEL - Abnormal; Notable for the following components:      Result Value   Sodium 131 (*)    Chloride 97 (*)    CO2 19 (*)    All other components within normal limits  CBC WITH DIFFERENTIAL/PLATELET  URINALYSIS, ROUTINE W REFLEX MICROSCOPIC    EKG None  Radiology DG Chest 2 View  Result Date: 01/05/2022 CLINICAL DATA:  Chest pain. EXAM: CHEST - 2 VIEW COMPARISON:  December 16, 2016. FINDINGS: The heart size and mediastinal contours are within normal limits. Both lungs are clear. The visualized skeletal structures are unremarkable. IMPRESSION: No active cardiopulmonary disease. Electronically Signed   By: Marijo Conception M.D.   On: 01/05/2022 09:17    Procedures Procedures    Medications  Ordered in ED Medications  fentaNYL (SUBLIMAZE) injection 50 mcg (50 mcg Intravenous Given 01/07/22 0620)  sodium chloride 0.9 % bolus 1,000 mL (0 mLs Intravenous Stopped 01/07/22 0646)  acetaminophen (TYLENOL) tablet 650 mg (650 mg Oral Given 01/07/22 0620)  iohexol (OMNIPAQUE) 300 MG/ML solution 80 mL (80 mLs Intravenous Contrast Given 01/07/22 6301)    ED Course/ Medical Decision Making/ A&P                           Medical Decision Making Amount and/or Complexity of Data Reviewed Labs: ordered. Radiology: ordered.  Risk OTC drugs. Prescription drug management.   Patient here for evaluation of abdominal pain, rectal pain.  He has noted to be febrile in the emergency department.  He incidentally has been sick with upper respiratory symptoms as well and tested positive for influenza and RSV 2 days ago.  On examination he does have some mild perirectal fullness on the right side without palpable abscess.  He does have tenderness over the perineum.  He refuses DRE.  Plan to obtain a CT abdomen pelvis to rule out abscess.  He was treated with IV fluids, pain medications.  Patient care transferred pending labs and CT scan.        Final Clinical Impression(s) / ED Diagnoses Final diagnoses:  None    Rx / DC Orders ED Discharge Orders     None         Quintella Reichert, MD 01/07/22 [phone]
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
## 2022-01-08 DIAGNOSIS — K611 Rectal abscess: Secondary | ICD-10-CM | POA: Diagnosis present

## 2022-01-08 LAB — CBC
HCT: 42.2 % (ref 39.0–52.0)
Hemoglobin: 14.2 g/dL (ref 13.0–17.0)
MCH: 28.2 pg (ref 26.0–34.0)
MCHC: 33.6 g/dL (ref 30.0–36.0)
MCV: 83.9 fL (ref 80.0–100.0)
Platelets: 204 10*3/uL (ref 150–400)
RBC: 5.03 MIL/uL (ref 4.22–5.81)
RDW: 13.8 % (ref 11.5–15.5)
WBC: 6.5 10*3/uL (ref 4.0–10.5)
nRBC: 0 % (ref 0.0–0.2)

## 2022-01-08 LAB — COMPREHENSIVE METABOLIC PANEL
ALT: 20 U/L (ref 0–44)
AST: 36 U/L (ref 15–41)
Albumin: 3.3 g/dL — ABNORMAL LOW (ref 3.5–5.0)
Alkaline Phosphatase: 41 U/L (ref 38–126)
Anion gap: 9 (ref 5–15)
BUN: 14 mg/dL (ref 6–20)
CO2: 22 mmol/L (ref 22–32)
Calcium: 8.7 mg/dL — ABNORMAL LOW (ref 8.9–10.3)
Chloride: 102 mmol/L (ref 98–111)
Creatinine, Ser: 0.96 mg/dL (ref 0.61–1.24)
GFR, Estimated: >60 mL/min (ref >60)
Glucose, Bld: 89 mg/dL (ref 70–99)
Potassium: 3.7 mmol/L (ref 3.5–5.1)
Sodium: 133 mmol/L — ABNORMAL LOW (ref 135–145)
Total Bilirubin: 0.7 mg/dL (ref 0.3–1.2)
Total Protein: 6.8 g/dL (ref 6.5–8.1)

## 2022-01-08 MED ORDER — OXYCODONE HCL 5 MG PO TABS: 5.0000 mg | ORAL_TABLET | Freq: Four times a day (QID) | ORAL | Status: DC | PRN

## 2022-01-08 MED ORDER — AMOXICILLIN-POT CLAVULANATE 875-125 MG PO TABS: 1.0000 | ORAL_TABLET | Freq: Two times a day (BID) | ORAL | Status: DC

## 2022-01-08 MED ORDER — GUAIFENESIN-DM 100-10 MG/5ML PO SYRP: 10.0000 mL | ORAL_SOLUTION | Freq: Four times a day (QID) | ORAL | Status: DC | PRN

## 2022-01-08 MED ORDER — AMOXICILLIN-POT CLAVULANATE 875-125 MG PO TABS: 1.0000 | ORAL_TABLET | Freq: Two times a day (BID) | ORAL | 0 refills | Status: AC

## 2022-01-08 NOTE — Progress Notes (Signed)
PROGRESS NOTE    Juan Patterson  UUV:[id number] DOB: 1990-05-28 DOA: 01/07/2022 PCP: Patient, No Pcp Per    Brief Narrative:  32 year old gentleman with history of smoking and marijuana presented with fever, abdominal and rectal pain for last 5 days.  Tried some over-the-counter medications.  He went to emergency room 2 days ago and was diagnosed with flu a and RSV.  Also reported planing of chronic rectal pain.  In the emergency room he was found to have a small perirectal abscess and admitted with IV antibiotics.   Assessment & Plan:   Perirectal abscess: Seen by surgery.  Decided conservative management.  Currently on IV Zosyn. Changing to oral antibiotics to see tolerance.  Adequate pain medications.  Sitz bath.  Mobility.  Bowel regimen.  RSV and influenza with URI symptoms: Supportive care.  Tylenol.  Cough medications.  Symptomatic for more than 5 days.  Currently no indication for antiviral use.  Smoking and marijuana use: Counseled to quit.   DVT prophylaxis: SCDs Start: 01/07/22 1037   Code Status: Full code Family Communication: None at the bedside Disposition Plan: Status is: Observation The patient will require care spanning > 2 midnights and should be moved to inpatient because: Monitoring on antibiotics.  Advancement of diet.     Consultants:  General surgery  Procedures:  None  Antimicrobials:  Zosyn 1/7---   Subjective: Patient seen in the morning rounds.  Cough congested.  He tells me he has very minimal pain in the rectum.  He was able to have 2 bowel movements overnight.  Afebrile.  Objective: Vitals:   01/08/22 0208 01/08/22 0210 01/08/22 0702 01/08/22 0932  BP:  110/71 115/73 114/69  Pulse: 62 63 65 71  Resp:  18 18 18   Temp: 99 F (37.2 C) 99 F (37.2 C) 98.8 F (37.1 C) 98.7 F (37.1 C)  TempSrc: Oral  Oral Oral  SpO2: 98% 96% 96% 100%  Weight:      Height:        Intake/Output Summary (Last 24 hours) at 01/08/2022 1246 Last data filed  at 01/08/2022 1000 Gross per 24 hour  Intake 3029.46 ml  Output --  Net 3029.46 ml   Filed Weights   01/07/22 0537  Weight: 90.7 kg    Examination:  General exam: Appears calm and comfortable  Respiratory system: Clear to auscultation. Respiratory effort normal. Cardiovascular system: S1 & S2 heard, RRR.  Gastrointestinal system: Soft tender.  Rectal exam not done by me.   Central nervous system: Alert and oriented. No focal neurological deficits. Extremities: Symmetric 5 x 5 power.    Data Reviewed: I have personally reviewed following labs and imaging studies  CBC: Recent Labs  Lab 01/05/22 0931 01/07/22 0614 01/08/22 0513  WBC 5.1 6.7 6.5  NEUTROABS  --  4.7  --   HGB 14.4 15.8 14.2  HCT 42.4 45.0 42.2  MCV 83.5 82.1 83.9  PLT 196 189 204   Basic Metabolic Panel: Recent Labs  Lab 01/05/22 0931 01/07/22 0614 01/08/22 0513  NA 132* 131* 133*  K 3.7 4.2 3.7  CL 100 97* 102  CO2 20* 19* 22  GLUCOSE 101* 99 89  BUN 16 15 14   CREATININE 1.13 1.07 0.96  CALCIUM 8.8* 9.0 8.7*   GFR: Estimated Creatinine Clearance: 139.2 mL/min (by C-G formula based on SCr of 0.96 mg/dL). Liver Function Tests: Recent Labs  Lab 01/07/22 0614 01/08/22 0513  AST 30 36  ALT 11 20  ALKPHOS 41 41  BILITOT 1.2 0.7  PROT 8.0 6.8  ALBUMIN 4.1 3.3*   No results for input(s): "LIPASE", "AMYLASE" in the last 168 hours. No results for input(s): "AMMONIA" in the last 168 hours. Coagulation Profile: No results for input(s): "INR", "PROTIME" in the last 168 hours. Cardiac Enzymes: No results for input(s): "CKTOTAL", "CKMB", "CKMBINDEX", "TROPONINI" in the last 168 hours. BNP (last 3 results) No results for input(s): "PROBNP" in the last 8760 hours. HbA1C: No results for input(s): "HGBA1C" in the last 72 hours. CBG: No results for input(s): "GLUCAP" in the last 168 hours. Lipid Profile: No results for input(s): "CHOL", "HDL", "LDLCALC", "TRIG", "CHOLHDL", "LDLDIRECT" in the last  72 hours. Thyroid Function Tests: No results for input(s): "TSH", "T4TOTAL", "FREET4", "T3FREE", "THYROIDAB" in the last 72 hours. Anemia Panel: No results for input(s): "VITAMINB12", "FOLATE", "FERRITIN", "TIBC", "IRON", "RETICCTPCT" in the last 72 hours. Sepsis Labs: No results for input(s): "PROCALCITON", "LATICACIDVEN" in the last 168 hours.  Recent Results (from the past 240 hour(s))  Resp panel by RT-PCR (RSV, Flu A&B, Covid) Anterior Nasal Swab     Status: Abnormal   Collection Time: 01/05/22  9:07 AM   Specimen: Anterior Nasal Swab  Result Value Ref Range Status   SARS Coronavirus 2 by RT PCR NEGATIVE NEGATIVE Final    Comment: (NOTE) SARS-CoV-2 target nucleic acids are NOT DETECTED.  The SARS-CoV-2 RNA is generally detectable in upper respiratory specimens during the acute phase of infection. The lowest concentration of SARS-CoV-2 viral copies this assay can detect is 138 copies/mL. A negative result does not preclude SARS-Cov-2 infection and should not be used as the sole basis for treatment or other patient management decisions. A negative result may occur with  improper specimen collection/handling, submission of specimen other than nasopharyngeal swab, presence of viral mutation(s) within the areas targeted by this assay, and inadequate number of viral copies(<138 copies/mL). A negative result must be combined with clinical observations, patient history, and epidemiological information. The expected result is Negative.  Fact Sheet for Patients:  EntrepreneurPulse.com.au  Fact Sheet for Healthcare Providers:  IncredibleEmployment.be  This test is no t yet approved or cleared by the [place] FDA and  has been authorized for detection and/or diagnosis of SARS-CoV-2 by FDA under an Emergency Use Authorization (EUA). This EUA will remain  in effect (meaning this test can be used) for the duration of the COVID-19 declaration under  Section 564(b)(1) of the Act, 21 U.S.C.section 360bbb-3(b)(1), unless the authorization is terminated  or revoked sooner.       Influenza A by PCR POSITIVE (A) NEGATIVE Final   Influenza B by PCR NEGATIVE NEGATIVE Final    Comment: (NOTE) The Xpert Xpress SARS-CoV-2/FLU/RSV plus assay is intended as an aid in the diagnosis of influenza from Nasopharyngeal swab specimens and should not be used as a sole basis for treatment. Nasal washings and aspirates are unacceptable for Xpert Xpress SARS-CoV-2/FLU/RSV testing.  Fact Sheet for Patients: EntrepreneurPulse.com.au  Fact Sheet for Healthcare Providers: IncredibleEmployment.be  This test is not yet approved or cleared by the [place] FDA and has been authorized for detection and/or diagnosis of SARS-CoV-2 by FDA under an Emergency Use Authorization (EUA). This EUA will remain in effect (meaning this test can be used) for the duration of the COVID-19 declaration under Section 564(b)(1) of the Act, 21 U.S.C. section 360bbb-3(b)(1), unless the authorization is terminated or revoked.     Resp Syncytial Virus by PCR POSITIVE (A) NEGATIVE Final    Comment: (NOTE) Fact Sheet  for Patients: BloggerCourse.com  Fact Sheet for Healthcare Providers: SeriousBroker.it  This test is not yet approved or cleared by the [place] FDA and has been authorized for detection and/or diagnosis of SARS-CoV-2 by FDA under an Emergency Use Authorization (EUA). This EUA will remain in effect (meaning this test can be used) for the duration of the COVID-19 declaration under Section 564(b)(1) of the Act, 21 U.S.C. section 360bbb-3(b)(1), unless the authorization is terminated or revoked.  Performed at [facility], [address] [place], [place] [address]   Culture, blood (Routine X 2) w Reflex to ID Panel     Status: None (Preliminary result)    Collection Time: 01/07/22 11:05 AM   Specimen: BLOOD RIGHT ARM  Result Value Ref Range Status   Specimen Description   Final    BLOOD RIGHT ARM Performed at [facility], [address] [place], [place] [address]    Special Requests   Final    BOTTLES DRAWN AEROBIC AND ANAEROBIC Blood Culture adequate volume Performed at [facility], [address] [place], [place] [address]    Culture   Final    NO GROWTH < 24 HOURS Performed at [facility], [address], [place], [place] [address]    Report Status PENDING  Incomplete  Culture, blood (Routine X 2) w Reflex to ID Panel     Status: None (Preliminary result)   Collection Time: 01/07/22 11:05 AM   Specimen: BLOOD LEFT ARM  Result Value Ref Range Status   Specimen Description BLOOD LEFT ARM  Final   Special Requests   Final    BOTTLES DRAWN AEROBIC AND ANAEROBIC Blood Culture adequate volume   Culture   Final    NO GROWTH < 24 HOURS Performed at [facility], [address] [place], [place] [address]    Report Status PENDING  Incomplete         Radiology Studies: CT Abdomen Pelvis W Contrast  Result Date: 01/07/2022 CLINICAL DATA:  Nonlocalized central abdominal pain with rectal pain and fever. EXAM: CT ABDOMEN AND PELVIS WITH CONTRAST TECHNIQUE: Multidetector CT imaging of the abdomen and pelvis was performed using the standard protocol following bolus administration of intravenous contrast. RADIATION DOSE REDUCTION: This exam was performed according to the departmental dose-optimization program which includes automated exposure control, adjustment of the mA and/or kV according to patient size and/or use of iterative reconstruction technique. CONTRAST:  108mL OMNIPAQUE IOHEXOL 300 MG/ML  SOLN COMPARISON:  CT pelvis 06/28/2021.  Abdomen and pelvis CT 05/01/2021 FINDINGS: Lower chest: 5 mm peripheral left lower lobe nodule on 05/04 is compatible with atelectasis. Other scattered foci of  atelectasis are noted in both lung bases. Hepatobiliary: Stable 15 mm hypoattenuating lesion in the medial segment left liver. This cannot be definitively characterized but is stable since prior study and comparing back to an exam from 20/12 consistent with benign etiology. No followup imaging is recommended. There is no evidence for gallstones, gallbladder wall thickening, or pericholecystic fluid. No intrahepatic or extrahepatic biliary dilation. Pancreas: No focal mass lesion. No dilatation of the main duct. No intraparenchymal cyst. No peripancreatic edema. Spleen: No splenomegaly. No focal mass lesion. Adrenals/Urinary Tract: No adrenal nodule or mass. Kidneys unremarkable. No evidence for hydroureter. The urinary bladder appears normal for the degree of distention. Stomach/Bowel: Stomach is unremarkable. No gastric wall thickening. No evidence of outlet obstruction. Duodenum is normally positioned as is the ligament of Treitz. No small  bowel wall thickening. No small bowel dilatation. The appendix is normal. Right and transverse colon are diffusely distended with gas and fluid. Left colon is decompressed with no discrete transition zone or obstructing lesion evident. Anatomy of the distal rectum/anus is not well demonstrated although there is a crescentic rim enhancing fluid collection at the low rectum/anal region (image 80/2). This may well simply represent fluid/stool in a nondistended low rectum. Given the patient's history of rectal pain, perirectal abscess would also be a consideration although there is not a substantial amount of perirectal edema/inflammation as would be expected in that setting. Vascular/Lymphatic: There is mild atherosclerotic calcification of the abdominal aorta without aneurysm. There is no gastrohepatic or hepatoduodenal ligament lymphadenopathy. No retroperitoneal or mesenteric lymphadenopathy. No pelvic sidewall lymphadenopathy. Reproductive: The prostate gland and seminal  vesicles are unremarkable. Other: Trace free fluid is seen in the pelvis. Musculoskeletal: No worrisome lytic or sclerotic osseous abnormality. IMPRESSION: 1. Anatomy of the distal rectum/anus is not well demonstrated although there is a crescentic rim enhancing fluid collection at the low rectum/anal region. This may well simply represent fluid/stool in a nondistended low rectum. Given the patient's history of rectal pain, perirectal abscess would also be a consideration although there is not a substantial amount of perirectal edema/inflammation as would be expected in that setting. If clinical picture is suspicious for perirectal abscess, pelvic MRI with and without contrast may prove helpful to further evaluate. 2. Right and transverse colon are diffusely distended with gas and fluid. Left colon is decompressed with no discrete transition zone or obstructing lesion evident. 3. Trace free fluid in the pelvis. 4.  Aortic Atherosclerosis (ICD10-I70.0). Electronically Signed   By: Kennith Center M.D.   On: 01/07/2022 07:22        Scheduled Meds:  amoxicillin-clavulanate  1 tablet Oral Q12H   Continuous Infusions:  sodium chloride Stopped (01/07/22 0907)   sodium chloride 100 mL/hr at 01/08/22 1243     LOS: 0 days    Time spent: 25 minutes    Dorcas Carrow, MD [facility] Pager [phone]

## 2022-01-08 NOTE — Plan of Care (Signed)

## 2022-01-08 NOTE — Progress Notes (Signed)
Progress Note     Subjective: Pt reports improvement in rectal pain. Having bowel function, mostly diarrhea presently. Reports he had a cyst removed from right buttock previously.   Objective: Vital signs in last 24 hours: Temp:  [98.4 F (36.9 C)-100 F (37.8 C)] 98.7 F (37.1 C) (01/08 0932) Pulse Rate:  [62-72] 71 (01/08 0932) Resp:  [18] 18 (01/08 0932) BP: (110-127)/(63-84) 114/69 (01/08 0932) SpO2:  [96 %-100 %] 100 % (01/08 0932)    Intake/Output from previous day: 01/07 0701 - 01/08 0700 In: 2856.7 [P.O.:390; I.V.:1897.2; IV Piggyback:569.6] Out: -  Intake/Output this shift: Total I/O In: 657.6 [P.O.:240; I.V.:400; IV Piggyback:17.6] Out: -   PE: General: pleasant, WD, WN male who is laying in bed in NAD Heart: regular, rate, and rhythm.   Lungs: Respiratory effort nonlabored GU: no external cellulitis, small open area in right buttock without purulence noted, internal exam deferred at this time  Psych: A&Ox3 with an appropriate affect.    Lab Results:  Recent Labs    01/07/22 0614 01/08/22 0513  WBC 6.7 6.5  HGB 15.8 14.2  HCT 45.0 42.2  PLT 189 204   BMET Recent Labs    01/07/22 0614 01/08/22 0513  NA 131* 133*  K 4.2 3.7  CL 97* 102  CO2 19* 22  GLUCOSE 99 89  BUN 15 14  CREATININE 1.07 0.96  CALCIUM 9.0 8.7*   PT/INR No results for input(s): "LABPROT", "INR" in the last 72 hours. CMP     Component Value Date/Time   NA 133 (L) 01/08/2022 0513   K 3.7 01/08/2022 0513   CL 102 01/08/2022 0513   CO2 22 01/08/2022 0513   GLUCOSE 89 01/08/2022 0513   BUN 14 01/08/2022 0513   CREATININE 0.96 01/08/2022 0513   CALCIUM 8.7 (L) 01/08/2022 0513   PROT 6.8 01/08/2022 0513   ALBUMIN 3.3 (L) 01/08/2022 0513   AST 36 01/08/2022 0513   ALT 20 01/08/2022 0513   ALKPHOS 41 01/08/2022 0513   BILITOT 0.7 01/08/2022 0513   GFRNONAA >60 01/08/2022 0513   GFRAA >60 08/28/2010 1941   Lipase     Component Value Date/Time   LIPASE 34  05/01/2021 0339       Studies/Results: CT Abdomen Pelvis W Contrast  Result Date: 01/07/2022 CLINICAL DATA:  Nonlocalized central abdominal pain with rectal pain and fever. EXAM: CT ABDOMEN AND PELVIS WITH CONTRAST TECHNIQUE: Multidetector CT imaging of the abdomen and pelvis was performed using the standard protocol following bolus administration of intravenous contrast. RADIATION DOSE REDUCTION: This exam was performed according to the departmental dose-optimization program which includes automated exposure control, adjustment of the mA and/or kV according to patient size and/or use of iterative reconstruction technique. CONTRAST:  75mL OMNIPAQUE IOHEXOL 300 MG/ML  SOLN COMPARISON:  CT pelvis 06/28/2021.  Abdomen and pelvis CT 05/01/2021 FINDINGS: Lower chest: 5 mm peripheral left lower lobe nodule on 05/04 is compatible with atelectasis. Other scattered foci of atelectasis are noted in both lung bases. Hepatobiliary: Stable 15 mm hypoattenuating lesion in the medial segment left liver. This cannot be definitively characterized but is stable since prior study and comparing back to an exam from 20/12 consistent with benign etiology. No followup imaging is recommended. There is no evidence for gallstones, gallbladder wall thickening, or pericholecystic fluid. No intrahepatic or extrahepatic biliary dilation. Pancreas: No focal mass lesion. No dilatation of the main duct. No intraparenchymal cyst. No peripancreatic edema. Spleen: No splenomegaly. No focal mass lesion. Adrenals/Urinary  Tract: No adrenal nodule or mass. Kidneys unremarkable. No evidence for hydroureter. The urinary bladder appears normal for the degree of distention. Stomach/Bowel: Stomach is unremarkable. No gastric wall thickening. No evidence of outlet obstruction. Duodenum is normally positioned as is the ligament of Treitz. No small bowel wall thickening. No small bowel dilatation. The appendix is normal. Right and transverse colon are  diffusely distended with gas and fluid. Left colon is decompressed with no discrete transition zone or obstructing lesion evident. Anatomy of the distal rectum/anus is not well demonstrated although there is a crescentic rim enhancing fluid collection at the low rectum/anal region (image 80/2). This may well simply represent fluid/stool in a nondistended low rectum. Given the patient's history of rectal pain, perirectal abscess would also be a consideration although there is not a substantial amount of perirectal edema/inflammation as would be expected in that setting. Vascular/Lymphatic: There is mild atherosclerotic calcification of the abdominal aorta without aneurysm. There is no gastrohepatic or hepatoduodenal ligament lymphadenopathy. No retroperitoneal or mesenteric lymphadenopathy. No pelvic sidewall lymphadenopathy. Reproductive: The prostate gland and seminal vesicles are unremarkable. Other: Trace free fluid is seen in the pelvis. Musculoskeletal: No worrisome lytic or sclerotic osseous abnormality. IMPRESSION: 1. Anatomy of the distal rectum/anus is not well demonstrated although there is a crescentic rim enhancing fluid collection at the low rectum/anal region. This may well simply represent fluid/stool in a nondistended low rectum. Given the patient's history of rectal pain, perirectal abscess would also be a consideration although there is not a substantial amount of perirectal edema/inflammation as would be expected in that setting. If clinical picture is suspicious for perirectal abscess, pelvic MRI with and without contrast may prove helpful to further evaluate. 2. Right and transverse colon are diffusely distended with gas and fluid. Left colon is decompressed with no discrete transition zone or obstructing lesion evident. 3. Trace free fluid in the pelvis. 4.  Aortic Atherosclerosis (ICD10-I70.0). Electronically Signed   By: Kennith Center M.D.   On: 01/07/2022 07:22     Anti-infectives: Anti-infectives (From admission, onward)    Start     Dose/Rate Route Frequency Ordered Stop   01/08/22 1800  amoxicillin-clavulanate (AUGMENTIN) 875-125 MG per tablet 1 tablet        1 tablet Oral Every 12 hours 01/08/22 1004 01/18/22 2159   01/07/22 1630  piperacillin-tazobactam (ZOSYN) IVPB 3.375 g  Status:  Discontinued        3.375 g 12.5 mL/hr over 240 Minutes Intravenous Every 8 hours 01/07/22 1105 01/08/22 1004   01/07/22 1500  piperacillin-tazobactam (ZOSYN) IVPB 3.375 g  Status:  Discontinued        3.375 g 12.5 mL/hr over 240 Minutes Intravenous Every 8 hours 01/07/22 0811 01/07/22 1105   01/07/22 1400  piperacillin-tazobactam (ZOSYN) IVPB 3.375 g  Status:  Discontinued        3.375 g 12.5 mL/hr over 240 Minutes Intravenous Every 8 hours 01/07/22 1058 01/07/22 1102   01/07/22 0815  piperacillin-tazobactam (ZOSYN) IVPB 3.375 g        3.375 g 100 mL/hr over 30 Minutes Intravenous  Once 01/07/22 0811 01/07/22 3810        Assessment/Plan ?Perirectal abscess - CT 1/7 with crescentic rim enhancing fluid collection at the low rectum/anal region. This may well simply represent fluid/stool in a nondistended low rectum. Given the patient's history of rectal pain, perirectal abscess would also be a consideration although there is not a substantial amount of perirectal edema/inflammation as would be expected in that setting -  no leukocytosis on admit or currently - ok to switch to PO abx and would recommend total course of abx 10 days  - ok to have regular diet - sitz/showers - if tolerating diet and PO abx without recurrent symptoms or worsening of rectal pain then would recommend discharge home tomorrow from general surgery standpoint - if recurrent rectal pain, could consider EUA vs repeat imaging   FEN: reg diet, IVF per TRH  VTE: ok to have LMWH or SQH from surgery standpoint  ID: zosyn 1/7>1/8; switch to PO augmentin this AM  Flu A and RSV positive  - per TRH  Tobacco abuse    LOS: 0 days   I reviewed ED provider notes, hospitalist notes, last 24 h vitals and pain scores, last 48 h intake and output, last 24 h labs and trends, and last 24 h imaging results.   Norm Parcel, [facility] 01/08/2022, 11:12 AM Please see Amion for pager number during day hours 7:00am-4:30pm

## 2022-01-08 NOTE — Discharge Summary (Signed)
Physician Discharge Summary  Juan Patterson NFA:[id number] DOB: [birth year] DOA: 01/07/2022  PCP: Patient, No Pcp Per  Admit date: 01/07/2022 Discharge date: 01/08/2022  Admitted From: Home  Disposition:  Home   Recommendations for Outpatient Follow-up:  Follow up with PCP in 1-2 weeks   Home Health:NA  Equipment/Devices:NA   Discharge Condition:Stable   CODE STATUS:Full code  Diet recommendation: regular diet   Discharge Summary: 32 year old gentleman with history of smoking and marijuana presented with fever, abdominal and rectal pain for last 5 days.  Tried some over-the-counter medications.  He went to emergency room 2 days ago and was diagnosed with flu a and RSV.  Also reported exacerbation of chronic rectal pain.  In the emergency room he was found to have a small perirectal abscess and admitted with IV antibiotics.  Perirectal abscess: Seen by surgery.  Decided conservative management. Treated with IV Zosyn. On re examination today, he has very small spontaneously drained peri rectal abscess. Changed to oral augmentin. He was advised monitoring by surgery today, however he feels all his symptoms are improved so wants to leave.  Discharged with Augmentin for 7 days  Tylenol for pain medications.  Sitz bath.    RSV and influenza with URI symptoms: Supportive care.  Tylenol.  Cough medications.  Symptomatic for more than 5 days.  Currently no indication for antiviral use.   Smoking and marijuana use: Counseled to quit.    Discharge Diagnoses:  Principal Problem:   Perirectal abscess Active Problems:   RSV (respiratory syncytial virus pneumonia)   Influenza A   Tobacco abuse   Marijuana abuse   Abscess, perirectal    Discharge Instructions  Discharge Instructions     Diet general   Complete by: As directed    Discharge instructions   Complete by: As directed    Use sitz bath as instructed   Increase activity slowly   Complete by: As directed       Allergies as of  01/08/2022   No Known Allergies      Medication List     STOP taking these medications    HYDROcodone-acetaminophen 5-325 MG tablet Commonly known as: NORCO/VICODIN   metroNIDAZOLE 500 MG tablet Commonly known as: FLAGYL   NYQUIL PO   sulfamethoxazole-trimethoprim 800-160 MG tablet Commonly known as: BACTRIM DS   THERAFLU COLD & COUGH PO       TAKE these medications    acetaminophen 500 MG tablet Commonly known as: TYLENOL Take 1,000 mg by mouth every 6 (six) hours as needed for mild pain or fever.   amoxicillin-clavulanate 875-125 MG tablet Commonly known as: AUGMENTIN Take 1 tablet by mouth every 12 (twelve) hours for 7 days.   [employer] HEMORRHOIDAL RELIEF MAX ST EX Apply 1 Application topically in the morning, at noon, and at bedtime.   VITAMIN C PO Take 1 tablet by mouth daily.        No Known Allergies  Consultations: General surgery    Procedures/Studies: CT Abdomen Pelvis W Contrast  Result Date: 01/07/2022 CLINICAL DATA:  Nonlocalized central abdominal pain with rectal pain and fever. EXAM: CT ABDOMEN AND PELVIS WITH CONTRAST TECHNIQUE: Multidetector CT imaging of the abdomen and pelvis was performed using the standard protocol following bolus administration of intravenous contrast. RADIATION DOSE REDUCTION: This exam was performed according to the departmental dose-optimization program which includes automated exposure control, adjustment of the mA and/or kV according to patient size and/or use of iterative reconstruction technique. CONTRAST:  9mL OMNIPAQUE IOHEXOL 300 MG/ML  SOLN COMPARISON:  CT pelvis 06/28/2021.  Abdomen and pelvis CT 05/01/2021 FINDINGS: Lower chest: 5 mm peripheral left lower lobe nodule on 05/04 is compatible with atelectasis. Other scattered foci of atelectasis are noted in both lung bases. Hepatobiliary: Stable 15 mm hypoattenuating lesion in the medial segment left liver. This cannot be definitively characterized but is stable since  prior study and comparing back to an exam from 20/12 consistent with benign etiology. No followup imaging is recommended. There is no evidence for gallstones, gallbladder wall thickening, or pericholecystic fluid. No intrahepatic or extrahepatic biliary dilation. Pancreas: No focal mass lesion. No dilatation of the main duct. No intraparenchymal cyst. No peripancreatic edema. Spleen: No splenomegaly. No focal mass lesion. Adrenals/Urinary Tract: No adrenal nodule or mass. Kidneys unremarkable. No evidence for hydroureter. The urinary bladder appears normal for the degree of distention. Stomach/Bowel: Stomach is unremarkable. No gastric wall thickening. No evidence of outlet obstruction. Duodenum is normally positioned as is the ligament of Treitz. No small bowel wall thickening. No small bowel dilatation. The appendix is normal. Right and transverse colon are diffusely distended with gas and fluid. Left colon is decompressed with no discrete transition zone or obstructing lesion evident. Anatomy of the distal rectum/anus is not well demonstrated although there is a crescentic rim enhancing fluid collection at the low rectum/anal region (image 80/2). This may well simply represent fluid/stool in a nondistended low rectum. Given the patient's history of rectal pain, perirectal abscess would also be a consideration although there is not a substantial amount of perirectal edema/inflammation as would be expected in that setting. Vascular/Lymphatic: There is mild atherosclerotic calcification of the abdominal aorta without aneurysm. There is no gastrohepatic or hepatoduodenal ligament lymphadenopathy. No retroperitoneal or mesenteric lymphadenopathy. No pelvic sidewall lymphadenopathy. Reproductive: The prostate gland and seminal vesicles are unremarkable. Other: Trace free fluid is seen in the pelvis. Musculoskeletal: No worrisome lytic or sclerotic osseous abnormality. IMPRESSION: 1. Anatomy of the distal rectum/anus is  not well demonstrated although there is a crescentic rim enhancing fluid collection at the low rectum/anal region. This may well simply represent fluid/stool in a nondistended low rectum. Given the patient's history of rectal pain, perirectal abscess would also be a consideration although there is not a substantial amount of perirectal edema/inflammation as would be expected in that setting. If clinical picture is suspicious for perirectal abscess, pelvic MRI with and without contrast may prove helpful to further evaluate. 2. Right and transverse colon are diffusely distended with gas and fluid. Left colon is decompressed with no discrete transition zone or obstructing lesion evident. 3. Trace free fluid in the pelvis. 4.  Aortic Atherosclerosis (ICD10-I70.0). Electronically Signed   By: Kennith Center M.D.   On: 01/07/2022 07:22   DG Chest 2 View  Result Date: 01/05/2022 CLINICAL DATA:  Chest pain. EXAM: CHEST - 2 VIEW COMPARISON:  December 16, 2016. FINDINGS: The heart size and mediastinal contours are within normal limits. Both lungs are clear. The visualized skeletal structures are unremarkable. IMPRESSION: No active cardiopulmonary disease. Electronically Signed   By: Lupita Raider M.D.   On: 01/05/2022 09:17   (Echo, Carotid, EGD, Colonoscopy, ERCP)    Subjective: Patient was seen in the morning rounds.  By evening he wants to leave.  He denied any pain.  On room air.  Walking around.  Dressed up.   Discharge Exam: Vitals:   01/08/22 0932 01/08/22 1317  BP: 114/69 122/81  Pulse: 71 61  Resp: 18 18  Temp: 98.7 F (37.1  C) 98.2 F (36.8 C)  SpO2: 100% 100%   Vitals:   01/08/22 0210 01/08/22 0702 01/08/22 0932 01/08/22 1317  BP: 110/71 115/73 114/69 122/81  Pulse: 63 65 71 61  Resp: 18 18 18 18   Temp: 99 F (37.2 C) 98.8 F (37.1 C) 98.7 F (37.1 C) 98.2 F (36.8 C)  TempSrc:  Oral Oral Oral  SpO2: 96% 96% 100% 100%  Weight:      Height:        General: Pt is alert, awake,  not in acute distress Cardiovascular: RRR, S1/S2 +, no rubs, no gallops Respiratory: CTA bilaterally, no wheezing, no rhonchi Abdominal: Soft, NT, ND, bowel sounds + Extremities: no edema, no cyanosis  No rectal exam was done by this provider at the time of discharge.  Exam done by surgery.    The results of significant diagnostics from this hospitalization (including imaging, microbiology, ancillary and laboratory) are listed below for reference.     Microbiology: Recent Results (from the past 240 hour(s))  Resp panel by RT-PCR (RSV, Flu A&B, Covid) Anterior Nasal Swab     Status: Abnormal   Collection Time: 01/05/22  9:07 AM   Specimen: Anterior Nasal Swab  Result Value Ref Range Status   SARS Coronavirus 2 by RT PCR NEGATIVE NEGATIVE Final    Comment: (NOTE) SARS-CoV-2 target nucleic acids are NOT DETECTED.  The SARS-CoV-2 RNA is generally detectable in upper respiratory specimens during the acute phase of infection. The lowest concentration of SARS-CoV-2 viral copies this assay can detect is 138 copies/mL. A negative result does not preclude SARS-Cov-2 infection and should not be used as the sole basis for treatment or other patient management decisions. A negative result may occur with  improper specimen collection/handling, submission of specimen other than nasopharyngeal swab, presence of viral mutation(s) within the areas targeted by this assay, and inadequate number of viral copies(<138 copies/mL). A negative result must be combined with clinical observations, patient history, and epidemiological information. The expected result is Negative.  Fact Sheet for Patients:  03/06/22  Fact Sheet for Healthcare Providers:  BloggerCourse.com  This test is no t yet approved or cleared by the [place] FDA and  has been authorized for detection and/or diagnosis of SARS-CoV-2 by FDA under an Emergency Use  Authorization (EUA). This EUA will remain  in effect (meaning this test can be used) for the duration of the COVID-19 declaration under Section 564(b)(1) of the Act, 21 U.S.C.section 360bbb-3(b)(1), unless the authorization is terminated  or revoked sooner.       Influenza A by PCR POSITIVE (A) NEGATIVE Final   Influenza B by PCR NEGATIVE NEGATIVE Final    Comment: (NOTE) The Xpert Xpress SARS-CoV-2/FLU/RSV plus assay is intended as an aid in the diagnosis of influenza from Nasopharyngeal swab specimens and should not be used as a sole basis for treatment. Nasal washings and aspirates are unacceptable for Xpert Xpress SARS-CoV-2/FLU/RSV testing.  Fact Sheet for Patients: [place]  Fact Sheet for Healthcare Providers: BloggerCourse.com  This test is not yet approved or cleared by the [place] FDA and has been authorized for detection and/or diagnosis of SARS-CoV-2 by FDA under an Emergency Use Authorization (EUA). This EUA will remain in effect (meaning this test can be used) for the duration of the COVID-19 declaration under Section 564(b)(1) of the Act, 21 U.S.C. section 360bbb-3(b)(1), unless the authorization is terminated or revoked.     Resp Syncytial Virus by PCR POSITIVE (A) NEGATIVE Final  Comment: (NOTE) Fact Sheet for Patients: EntrepreneurPulse.com.au  Fact Sheet for Healthcare Providers: IncredibleEmployment.be  This test is not yet approved or cleared by the [place] FDA and has been authorized for detection and/or diagnosis of SARS-CoV-2 by FDA under an Emergency Use Authorization (EUA). This EUA will remain in effect (meaning this test can be used) for the duration of the COVID-19 declaration under Section 564(b)(1) of the Act, 21 U.S.C. section 360bbb-3(b)(1), unless the authorization is terminated or revoked.  Performed at [facility], [address] [place], [place] [address]   Culture, blood (Routine X 2) w Reflex to ID Panel     Status: None (Preliminary result)   Collection Time: 01/07/22 11:05 AM   Specimen: BLOOD RIGHT ARM  Result Value Ref Range Status   Specimen Description   Final    BLOOD RIGHT ARM Performed at [facility], [place] [address], [place], [place] [address]    Special Requests   Final    BOTTLES DRAWN AEROBIC AND ANAEROBIC Blood Culture adequate volume Performed at [facility] [address] [place], [place] [address]    Culture   Final    NO GROWTH < 24 HOURS Performed at [facility] [address] [place],  [address]    Report Status PENDING  Incomplete  Culture, blood (Routine X 2) w Reflex to ID Panel     Status: None (Preliminary result)   Collection Time: 01/07/22 11:05 AM   Specimen: BLOOD LEFT ARM  Result Value Ref Range Status   Specimen Description BLOOD LEFT ARM  Final   Special Requests   Final    BOTTLES DRAWN AEROBIC AND ANAEROBIC Blood Culture adequate volume   Culture   Final    NO GROWTH < 24 HOURS Performed at [facility], [place] [address] [place],  [address]    Report Status PENDING  Incomplete     Labs: BNP (last 3 results) No results for input(s): "BNP" in the last 8760 hours. Basic Metabolic Panel: Recent Labs  Lab 01/05/22 0931 01/07/22 0614 01/08/22 0513  NA 132* 131* 133*  K 3.7 4.2 3.7  CL 100 97* 102  CO2 20* 19* 22  GLUCOSE 101* 99 89  BUN 16 15 14   CREATININE 1.13 1.07 0.96  CALCIUM 8.8* 9.0 8.7*   Liver Function Tests: Recent Labs  Lab 01/07/22 0614 01/08/22 0513  AST 30 36  ALT 11 20  ALKPHOS 41 41  BILITOT 1.2 0.7  PROT 8.0 6.8  ALBUMIN 4.1 3.3*   No results for input(s): "LIPASE", "AMYLASE" in the last 168 hours. No results for input(s): "AMMONIA" in the last 168 hours. CBC: Recent Labs  Lab 01/05/22 0931 01/07/22 0614 01/08/22 0513  WBC 5.1 6.7 6.5  NEUTROABS  --  4.7   --   HGB 14.4 15.8 14.2  HCT 42.4 45.0 42.2  MCV 83.5 82.1 83.9  PLT 196 189 204   Cardiac Enzymes: No results for input(s): "CKTOTAL", "CKMB", "CKMBINDEX", "TROPONINI" in the last 168 hours. BNP: Invalid input(s): "POCBNP" CBG: No results for input(s): "GLUCAP" in the last 168 hours. D-Dimer No results for input(s): "DDIMER" in the last 72 hours. Hgb A1c No results for input(s): "HGBA1C" in the last 72 hours. Lipid Profile No results for input(s): "CHOL", "HDL", "LDLCALC", "TRIG", "CHOLHDL", "LDLDIRECT" in the last 72 hours. Thyroid function studies No results for input(s): "TSH", "T4TOTAL", "T3FREE", "THYROIDAB" in the last 72 hours.  Invalid input(s): "FREET3" Anemia work up No results for input(s): "VITAMINB12", "FOLATE", "FERRITIN", "TIBC", "IRON", "RETICCTPCT" in the last 72 hours. Urinalysis    Component Value Date/Time   COLORURINE YELLOW 05/01/2021 0347   APPEARANCEUR CLEAR 05/01/2021 0347   LABSPEC >=1.030 05/01/2021 0347   PHURINE 5.5 05/01/2021 0347   GLUCOSEU NEGATIVE 05/01/2021 0347   HGBUR NEGATIVE 05/01/2021 0347   BILIRUBINUR SMALL (A) 05/01/2021 0347   KETONESUR NEGATIVE 05/01/2021 0347   PROTEINUR NEGATIVE 05/01/2021 0347   NITRITE NEGATIVE 05/01/2021 0347   LEUKOCYTESUR NEGATIVE 05/01/2021 0347   Sepsis Labs Recent Labs  Lab 01/05/22 0931 01/07/22 0614 01/08/22 0513  WBC 5.1 6.7 6.5   Microbiology Recent Results (from the past 240 hour(s))  Resp panel by RT-PCR (RSV, Flu A&B, Covid) Anterior Nasal Swab     Status: Abnormal   Collection Time: 01/05/22  9:07 AM   Specimen: Anterior Nasal Swab  Result Value Ref Range Status   SARS Coronavirus 2 by RT PCR NEGATIVE NEGATIVE Final    Comment: (NOTE) SARS-CoV-2 target nucleic acids are NOT DETECTED.  The SARS-CoV-2 RNA is generally detectable in upper respiratory specimens during the acute phase of infection. The lowest concentration of SARS-CoV-2 viral copies this assay can detect is 138  copies/mL. A negative result does not preclude SARS-Cov-2 infection and should not be used as the sole basis for treatment or other patient management decisions. A negative result may occur with  improper specimen collection/handling, submission of specimen other than nasopharyngeal swab, presence of viral mutation(s) within the areas targeted by this assay, and inadequate number of viral copies(<138 copies/mL). A negative result must be combined with clinical observations, patient history, and epidemiological information. The expected result is Negative.  Fact Sheet for Patients:  BloggerCourse.com  Fact Sheet for Healthcare Providers:  SeriousBroker.it  This test is no t yet approved or cleared by the [place] FDA and  has been authorized for detection and/or diagnosis of SARS-CoV-2 by FDA under an Emergency Use Authorization (EUA). This EUA will remain  in effect (meaning this test can be used) for the duration of the COVID-19 declaration under Section 564(b)(1) of the Act, 21 U.S.C.section 360bbb-3(b)(1), unless the authorization is terminated  or revoked sooner.       Influenza A by PCR POSITIVE (A) NEGATIVE Final   Influenza B by PCR NEGATIVE NEGATIVE Final    Comment: (NOTE) The Xpert Xpress SARS-CoV-2/FLU/RSV plus assay is intended as an aid in the diagnosis of influenza from Nasopharyngeal swab specimens and should not be used as a sole basis for treatment. Nasal washings and aspirates are unacceptable for Xpert Xpress SARS-CoV-2/FLU/RSV testing.  Fact Sheet for Patients: BloggerCourse.com  Fact Sheet for Healthcare Providers: SeriousBroker.it  This test is not yet approved or cleared by the [place] FDA and has been authorized for detection and/or diagnosis of SARS-CoV-2 by FDA under an Emergency Use Authorization (EUA). This EUA will remain in effect  (meaning this test can be used) for the duration of the COVID-19 declaration under Section 564(b)(1) of the Act, 21 U.S.C. section 360bbb-3(b)(1), unless the authorization is terminated or revoked.     Resp Syncytial Virus by PCR POSITIVE (A) NEGATIVE Final    Comment: (NOTE) Fact Sheet for Patients: BloggerCourse.com  Fact Sheet for Healthcare Providers: SeriousBroker.it  This test is not yet approved or cleared by the [place] FDA and has been authorized for detection and/or diagnosis of SARS-CoV-2 by FDA under an Emergency Use Authorization (EUA). This EUA will remain  in effect (meaning this test can be used) for the duration of the COVID-19 declaration under Section 564(b)(1) of the Act, 21 U.S.C. section 360bbb-3(b)(1), unless the authorization is terminated or revoked.  Performed at [facility], [address] [place], [place] [address]   Culture, blood (Routine X 2) w Reflex to ID Panel     Status: None (Preliminary result)   Collection Time: 01/07/22 11:05 AM   Specimen: BLOOD RIGHT ARM  Result Value Ref Range Status   Specimen Description   Final    BLOOD RIGHT ARM Performed at [facility], [address] [place], [place] [address]    Special Requests   Final    BOTTLES DRAWN AEROBIC AND ANAEROBIC Blood Culture adequate volume Performed at [facility], [address] [place], [place] [address]    Culture   Final    NO GROWTH < 24 HOURS Performed at [facility], [address] [place], [place] [address]    Report Status PENDING  Incomplete  Culture, blood (Routine X 2) w Reflex to ID Panel     Status: None (Preliminary result)   Collection Time: 01/07/22 11:05 AM   Specimen: BLOOD LEFT ARM  Result Value Ref Range Status   Specimen Description BLOOD LEFT ARM  Final   Special Requests   Final    BOTTLES DRAWN AEROBIC AND ANAEROBIC Blood Culture adequate volume    Culture   Final    NO GROWTH < 24 HOURS Performed at [facility], [address] [place], [place] [address]    Report Status PENDING  Incomplete     Time coordinating discharge:  32 minutes  SIGNED:   Dorcas CarrowKuber Emeline Simpson, MD  [facility] 01/08/2022, 4:27 PM

## 2022-01-08 NOTE — TOC CM/SW Note (Signed)
Transition of Care [facility]) Screening Note  Patient Details  Name: Juan Patterson Date of Birth: 1990-10-05  Transition of Care [facility]) CM/SW Contact:    Sherie Don, LCSW Phone Number: 01/08/2022, 2:08 PM  Transition of Care Department [facility]) has reviewed patient and no TOC needs have been identified at this time. We will continue to monitor patient advancement through interdisciplinary progression rounds. If new patient transition needs arise, please place a TOC consult.

## 2022-01-08 NOTE — Progress Notes (Signed)
Pharmacy Antibiotic Note  Juan Patterson is a 32 y.o. male admitted on 01/07/2022 with  perirectal abscess .  Pharmacy has been consulted for Zosyn dosing. SCr improved, CrCl > 100 ml/min .  Plan: Zosyn 3.375g IV (8min infusion) x1; then 3.375g IV q8h (4h infusion) No dose adjustments anticipated.  Pharmacy will sign off and monitor peripherally via electronic surveillance software for any changes in renal function or micro data.    Height: 6\' 5"  (195.6 cm) Weight: 90.7 kg (200 lb) IBW/kg (Calculated) : 89.1  Temp (24hrs), Avg:99.2 F (37.3 C), Min:98.4 F (36.9 C), Max:100 F (37.8 C)  Recent Labs  Lab 01/05/22 0931 01/07/22 0614 01/08/22 0513  WBC 5.1 6.7 6.5  CREATININE 1.13 1.07 0.96     Estimated Creatinine Clearance: 139.2 mL/min (by C-G formula based on SCr of 0.96 mg/dL).    No Known Allergies  Peggyann Juba, PharmD, [facility]: [phone] 01/08/2022 7:52 AM

## 2022-01-12 LAB — CULTURE, BLOOD (ROUTINE X 2)
Culture: NO GROWTH
Culture: NO GROWTH
Special Requests: ADEQUATE
Special Requests: ADEQUATE

## 2023-09-11 ENCOUNTER — Emergency Department (HOSPITAL_BASED_OUTPATIENT_CLINIC_OR_DEPARTMENT_OTHER)

## 2023-09-11 ENCOUNTER — Emergency Department (HOSPITAL_BASED_OUTPATIENT_CLINIC_OR_DEPARTMENT_OTHER)
Admission: EM | Admit: 2023-09-11 | Discharge: 2023-09-11 | Disposition: A | Discharge status: Home / Self Care | Attending: Emergency Medicine | Admitting: Emergency Medicine

## 2023-09-11 ENCOUNTER — Encounter (HOSPITAL_BASED_OUTPATIENT_CLINIC_OR_DEPARTMENT_OTHER): Payer: Self-pay

## 2023-09-11 ENCOUNTER — Other Ambulatory Visit: Payer: Self-pay

## 2023-09-11 DIAGNOSIS — S022XXA Fracture of nasal bones, initial encounter for closed fracture: Secondary | ICD-10-CM | POA: Diagnosis not present

## 2023-09-11 DIAGNOSIS — L0201 Cutaneous abscess of face: Secondary | ICD-10-CM | POA: Insufficient documentation

## 2023-09-11 DIAGNOSIS — L03213 Periorbital cellulitis: Secondary | ICD-10-CM | POA: Diagnosis not present

## 2023-09-11 DIAGNOSIS — R220 Localized swelling, mass and lump, head: Secondary | ICD-10-CM | POA: Diagnosis not present

## 2023-09-11 LAB — CBC WITH DIFFERENTIAL/PLATELET
Abs Immature Granulocytes: 0.02 K/uL (ref 0.00–0.07)
Basophils Absolute: 0 K/uL (ref 0.0–0.1)
Basophils Relative: 0 %
Eosinophils Absolute: 0.1 K/uL (ref 0.0–0.5)
Eosinophils Relative: 1 %
HCT: 43.3 % (ref 39.0–52.0)
Hemoglobin: 14.7 g/dL (ref 13.0–17.0)
Immature Granulocytes: 0 %
Lymphocytes Relative: 36 %
Lymphs Abs: 3.5 K/uL (ref 0.7–4.0)
MCH: 28.6 pg (ref 26.0–34.0)
MCHC: 33.9 g/dL (ref 30.0–36.0)
MCV: 84.2 fL (ref 80.0–100.0)
Monocytes Absolute: 0.8 K/uL (ref 0.1–1.0)
Monocytes Relative: 8 %
Neutro Abs: 5.3 K/uL (ref 1.7–7.7)
Neutrophils Relative %: 55 %
Platelets: 259 K/uL (ref 150–400)
RBC: 5.14 MIL/uL (ref 4.22–5.81)
RDW: 14.1 % (ref 11.5–15.5)
WBC: 9.7 K/uL (ref 4.0–10.5)
nRBC: 0 % (ref 0.0–0.2)

## 2023-09-11 LAB — BASIC METABOLIC PANEL WITH GFR
Anion gap: 14 (ref 5–15)
BUN: 14 mg/dL (ref 6–20)
CO2: 25 mmol/L (ref 22–32)
Calcium: 9.8 mg/dL (ref 8.9–10.3)
Chloride: 100 mmol/L (ref 98–111)
Creatinine, Ser: 1.04 mg/dL (ref 0.61–1.24)
GFR, Estimated: >60 mL/min (ref >60)
Glucose, Bld: 94 mg/dL (ref 70–99)
Potassium: 3.5 mmol/L (ref 3.5–5.1)
Sodium: 139 mmol/L (ref 135–145)

## 2023-09-11 LAB — LACTIC ACID, PLASMA: Lactic Acid, Venous: 0.6 mmol/L (ref 0.5–1.9)

## 2023-09-11 MED ORDER — CLINDAMYCIN HCL 150 MG PO CAPS: 300.0000 mg | ORAL_CAPSULE | Freq: Four times a day (QID) | ORAL | 0 refills | Status: DC

## 2023-09-11 MED ORDER — IOHEXOL 300 MG/ML  SOLN
75.0000 mL | Freq: Once | INTRAMUSCULAR | Status: AC | PRN
  Administered 2023-09-11: 75 mL via INTRAVENOUS

## 2023-09-11 MED ORDER — SODIUM CHLORIDE 0.9 % IV SOLN
INTRAVENOUS | Status: DC | PRN
  Administered 2023-09-11: 250 mL via INTRAVENOUS

## 2023-09-11 MED ORDER — CLINDAMYCIN PHOSPHATE 900 MG/50ML IV SOLN
900.0000 mg | Freq: Once | INTRAVENOUS | Status: AC
  Administered 2023-09-11: 900 mg via INTRAVENOUS
  Filled 2023-09-11: qty 50

## 2023-09-11 MED ORDER — LIDOCAINE HCL (PF) 1 % IJ SOLN
5.0000 mL | Freq: Once | INTRAMUSCULAR | Status: AC
  Administered 2023-09-11: 5 mL
  Filled 2023-09-11: qty 5

## 2023-09-11 NOTE — ED Triage Notes (Signed)
 Pt states he had a bump for a month and decided to squeeze it on Sunday.  States large amt of purulent drainage came out with +odor.   Next day he had facial swelling esp around left eye.   Today has become more swollen and painful

## 2023-09-11 NOTE — ED Provider Notes (Signed)
 [facility] Provider Note   CSN: [id number] Arrival date & time: 09/11/23  9956     Patient presents with: abscess on face   Juan Patterson is a 33 y.o. male.   Presents with concerns over abscess on face.  Patient reports that he has noticed a lump next to his left eye for at least a month.  Several days ago he squeezed the area and got some fluid out but since then he has had redness and swelling around the left eye.  No eye pain or pain with movement of the eye.  No vision change.       Prior to Admission medications   Medication Sig Start Date End Date Taking? Authorizing Provider  clindamycin  (CLEOCIN ) 150 MG capsule Take 2 capsules (300 mg total) by mouth 4 (four) times daily. 09/11/23  Yes Santanna Whitford, Lonni PARAS, MD  acetaminophen  (TYLENOL ) 500 MG tablet Take 1,000 mg by mouth every 6 (six) hours as needed for mild pain or fever.    [provider]  Ascorbic Acid (VITAMIN C PO) Take 1 tablet by mouth daily.    [provider]  Lidocaine , Anorectal, ([employer] HEMORRHOIDAL RELIEF MAX ST EX) Apply 1 Application topically in the morning, at noon, and at bedtime.    [provider]    Allergies: Patient has no known allergies.    Review of Systems  Updated Vital Signs BP 125/81 (BP Location: Left Arm)   Pulse 77   Temp 98 F (36.7 C)   Resp 18   Ht 6' 5 (1.956 m)   Wt 90.7 kg   SpO2 98%   BMI 23.72 kg/m   Physical Exam Vitals and nursing note reviewed.  Constitutional:      General: He is not in acute distress.    Appearance: He is well-developed.  HENT:     Head: Normocephalic and atraumatic.     Mouth/Throat:     Mouth: Mucous membranes are moist.  Eyes:     General: Vision grossly intact. Gaze aligned appropriately.     Extraocular Movements: Extraocular movements intact.     Conjunctiva/sclera: Conjunctivae normal.     Comments: Left sided periorbital edema with erythema  Cardiovascular:      Rate and Rhythm: Normal rate and regular rhythm.     Pulses: Normal pulses.     Heart sounds: Normal heart sounds, S1 normal and S2 normal. No murmur heard.    No friction rub. No gallop.  Pulmonary:     Effort: Pulmonary effort is normal. No respiratory distress.     Breath sounds: Normal breath sounds.  Abdominal:     Palpations: Abdomen is soft.     Tenderness: There is no abdominal tenderness. There is no guarding or rebound.     Hernia: No hernia is present.  Musculoskeletal:        General: No swelling.     Cervical back: Full passive range of motion without pain, normal range of motion and neck supple. No pain with movement, spinous process tenderness or muscular tenderness. Normal range of motion.     Right lower leg: No edema.     Left lower leg: No edema.  Skin:    General: Skin is warm and dry.     Capillary Refill: Capillary refill takes less than 2 seconds.     Findings: Erythema (Around left eye) present. No ecchymosis, lesion or wound.  Neurological:     Mental Status: He  is alert and oriented to person, place, and time.     GCS: GCS eye subscore is 4. GCS verbal subscore is 5. GCS motor subscore is 6.     Cranial Nerves: Cranial nerves 2-12 are intact.     Sensory: Sensation is intact.     Motor: Motor function is intact. No weakness or abnormal muscle tone.     Coordination: Coordination is intact.  Psychiatric:        Mood and Affect: Mood normal.        Speech: Speech normal.        Behavior: Behavior normal.     (all labs ordered are listed, but only abnormal results are displayed) Labs Reviewed  CBC WITH DIFFERENTIAL/PLATELET  BASIC METABOLIC PANEL WITH GFR  LACTIC ACID, PLASMA  LACTIC ACID, PLASMA    EKG: None  Radiology: CT Orbits W Contrast Result Date: 09/11/2023 CLINICAL DATA:  Initial evaluation for acute periorbital cellulitis. EXAM: CT ORBITS WITH CONTRAST TECHNIQUE: Multidetector CT images was performed according to the standard  protocol following intravenous contrast administration. RADIATION DOSE REDUCTION: This exam was performed according to the departmental dose-optimization program which includes automated exposure control, adjustment of the mA and/or kV according to patient size and/or use of iterative reconstruction technique. CONTRAST:  75mL OMNIPAQUE  IOHEXOL  300 MG/ML  SOLN COMPARISON:  None Available. FINDINGS: Orbits: Diffuse soft tissue swelling with hazy inflammatory stranding seen involving the left preseptal periorbital soft tissues, concerning for acute periorbital cellulitis. Changes are most pronounced at the lateral aspect of the left orbit. Superimposed small hypodensity measuring 9 mm just deep to the skin in this region suspicious for a small abscess (series 2, image 22). No other loculated or drainable fluid collections. No evidence for postseptal or intraorbital extension. Right globe and orbital soft tissues within normal limits. Visible paranasal sinuses: Mild chronic mucoperiosteal thickening present about the ethmoidal air cells. Paranasal sinuses are otherwise clear. Visualized mastoids and middle ear cavities are clear. Soft tissues: Changes of left preseptal periorbital cellulitis as above. Note made of an additional rounded hypodensity slightly inferiorly within the left face measuring 8 mm, felt to be most consistent with a small sebaceous cyst (series 2, image 11). Remainder the visualized soft tissues of the face otherwise unremarkable. Osseous: No acute osseous finding. Chronic bilateral nasal bone fractures noted. No worrisome osseous lesions. Limited intracranial: Unremarkable. IMPRESSION: Findings consistent with acute left preseptal periorbital cellulitis. Superimposed 9 mm hypodensity just deep to the skin at the lateral aspect of the left orbit, suspicious for a small subcutaneous abscess. No evidence for postseptal or intraorbital extension at this time. Electronically Signed   By: Morene Hoard M.D.   On: 09/11/2023 03:14     .Incision and Drainage  Date/Time: 09/11/2023 4:05 AM  Performed by: Haze Lonni PARAS, MD Authorized by: Haze Lonni PARAS, MD   Consent:    Consent obtained:  Verbal   Consent given by:  Patient   Risks, benefits, and alternatives were discussed: yes     Risks discussed:  Bleeding, incomplete drainage, pain and infection Universal protocol:    Procedure explained and questions answered to patient or proxy's satisfaction: yes     Relevant documents present and verified: yes     Test results available : yes     Imaging studies available: yes     Required blood products, implants, devices, and special equipment available: yes     Site/side marked: yes     Immediately prior to procedure, a  time out was called: yes     Patient identity confirmed:  Verbally with patient Location:    Type:  Abscess   Size:  1   Location:  Head   Head location:  Face Pre-procedure details:    Skin preparation:  Povidone-iodine Sedation:    Sedation type:  None Anesthesia:    Anesthesia method:  Local infiltration   Local anesthetic:  Lidocaine  1% w/o epi Procedure type:    Complexity:  Simple Procedure details:    Incision types:  Single straight   Wound management:  Probed and deloculated and irrigated with saline   Drainage:  Purulent   Drainage amount:  Copious   Wound treatment:  Wound left open Post-procedure details:    Procedure completion:  Tolerated well, no immediate complications    Medications Ordered in the ED  0.9 %  sodium chloride  infusion (250 mLs Intravenous New Bag/Given 09/11/23 0231)  clindamycin  (CLEOCIN ) IVPB 900 mg (900 mg Intravenous New Bag/Given 09/11/23 0232)  iohexol  (OMNIPAQUE ) 300 MG/ML solution 75 mL (75 mLs Intravenous Contrast Given 09/11/23 0239)  lidocaine  (PF) (XYLOCAINE ) 1 % injection 5 mL (5 mLs Infiltration Given by Other 09/11/23 0344)                                    Medical Decision  Making Amount and/or Complexity of Data Reviewed Labs: ordered. Radiology: ordered.  Risk Prescription drug management.   Differential diagnosis considered includes, but not limited to: Skin abscess; periorbital cellulitis; orbital cellulitis  Presents to the emergency department for evaluation of erythema, swelling around the left eye.  Patient reports that he had a bump in the area lateral to the eye about a month.  He squeezed it, got some fluid out of it but since then he has had increased swelling.  It sounds as though this was a cyst that got infected and now he is having periorbital edema secondary to extension to the skin adjacent to the abscess.  CT scan rules out orbital cellulitis.  Incision and drainage was performed.  There was some sebaceous material but a lot of pus as well.  Will continue antibiotics.  Given return precautions.     Final diagnoses:  Periorbital cellulitis of left eye  Cutaneous abscess of face    ED Discharge Orders          Ordered    clindamycin  (CLEOCIN ) 150 MG capsule  4 times daily        09/11/23 0408               Haze Lonni PARAS, MD 09/11/23 [phone]

## 2023-12-17 DIAGNOSIS — F17200 Nicotine dependence, unspecified, uncomplicated: Secondary | ICD-10-CM | POA: Diagnosis not present

## 2023-12-17 DIAGNOSIS — Z13220 Encounter for screening for lipoid disorders: Secondary | ICD-10-CM | POA: Diagnosis not present

## 2023-12-17 DIAGNOSIS — K625 Hemorrhage of anus and rectum: Secondary | ICD-10-CM | POA: Diagnosis not present

## 2023-12-17 DIAGNOSIS — Z113 Encounter for screening for infections with a predominantly sexual mode of transmission: Secondary | ICD-10-CM | POA: Diagnosis not present

## 2023-12-17 DIAGNOSIS — K5909 Other constipation: Secondary | ICD-10-CM | POA: Diagnosis not present

## 2023-12-17 DIAGNOSIS — Z131 Encounter for screening for diabetes mellitus: Secondary | ICD-10-CM | POA: Diagnosis not present

## 2023-12-17 DIAGNOSIS — Z1329 Encounter for screening for other suspected endocrine disorder: Secondary | ICD-10-CM | POA: Diagnosis not present

## 2023-12-17 DIAGNOSIS — Z1389 Encounter for screening for other disorder: Secondary | ICD-10-CM | POA: Diagnosis not present

## 2023-12-17 DIAGNOSIS — Z0000 Encounter for general adult medical examination without abnormal findings: Secondary | ICD-10-CM | POA: Diagnosis not present

## 2023-12-31 ENCOUNTER — Encounter: Payer: Self-pay | Admitting: Gastroenterology

## 2024-01-29 ENCOUNTER — Encounter: Payer: Self-pay | Admitting: Gastroenterology

## 2024-01-29 ENCOUNTER — Other Ambulatory Visit (INDEPENDENT_AMBULATORY_CARE_PROVIDER_SITE_OTHER)

## 2024-01-29 ENCOUNTER — Ambulatory Visit: Admitting: Gastroenterology

## 2024-01-29 VITALS — BP 122/84 | HR 91 | Ht 77.0 in | Wt 205.8 lb

## 2024-01-29 DIAGNOSIS — K625 Hemorrhage of anus and rectum: Secondary | ICD-10-CM

## 2024-01-29 DIAGNOSIS — K6289 Other specified diseases of anus and rectum: Secondary | ICD-10-CM | POA: Diagnosis not present

## 2024-01-29 DIAGNOSIS — K5909 Other constipation: Secondary | ICD-10-CM | POA: Diagnosis not present

## 2024-01-29 DIAGNOSIS — R198 Other specified symptoms and signs involving the digestive system and abdomen: Secondary | ICD-10-CM

## 2024-01-29 DIAGNOSIS — K611 Rectal abscess: Secondary | ICD-10-CM

## 2024-01-29 LAB — CBC WITH DIFFERENTIAL/PLATELET
Basophils Absolute: 0.1 10*3/uL (ref 0.0–0.1)
Basophils Relative: 1 % (ref 0.0–3.0)
Eosinophils Absolute: 0.1 10*3/uL (ref 0.0–0.7)
Eosinophils Relative: 0.8 % (ref 0.0–5.0)
HCT: 43.7 % (ref 39.0–52.0)
Hemoglobin: 14.6 g/dL (ref 13.0–17.0)
Lymphocytes Relative: 37.2 % (ref 12.0–46.0)
Lymphs Abs: 2.7 10*3/uL (ref 0.7–4.0)
MCHC: 33.3 g/dL (ref 30.0–36.0)
MCV: 87.1 fl (ref 78.0–100.0)
Monocytes Absolute: 0.4 10*3/uL (ref 0.1–1.0)
Monocytes Relative: 5.2 % (ref 3.0–12.0)
Neutro Abs: 4 10*3/uL (ref 1.4–7.7)
Neutrophils Relative %: 55.8 % (ref 43.0–77.0)
Platelets: 322 10*3/uL (ref 150.0–400.0)
RBC: 5.02 Mil/uL (ref 4.22–5.81)
RDW: 15.3 % (ref 11.5–15.5)
WBC: 7.1 10*3/uL (ref 4.0–10.5)

## 2024-01-29 LAB — SEDIMENTATION RATE: Sed Rate: 28 mm/h — ABNORMAL HIGH (ref 0–15)

## 2024-01-29 LAB — COMPREHENSIVE METABOLIC PANEL WITH GFR
ALT: 13 U/L (ref 3–53)
AST: 15 U/L (ref 5–37)
Albumin: 4.8 g/dL (ref 3.5–5.2)
Alkaline Phosphatase: 45 U/L (ref 39–117)
BUN: 9 mg/dL (ref 6–23)
CO2: 30 meq/L (ref 19–32)
Calcium: 9.9 mg/dL (ref 8.4–10.5)
Chloride: 103 meq/L (ref 96–112)
Creatinine, Ser: 0.86 mg/dL (ref 0.40–1.50)
GFR: 113.02 mL/min
Glucose, Bld: 92 mg/dL (ref 70–99)
Potassium: 4.3 meq/L (ref 3.5–5.1)
Sodium: 139 meq/L (ref 135–145)
Total Bilirubin: 0.6 mg/dL (ref 0.2–1.2)
Total Protein: 8.1 g/dL (ref 6.0–8.3)

## 2024-01-29 LAB — C-REACTIVE PROTEIN: CRP: <0.5 mg/dL — ABNORMAL LOW (ref 1.0–20.0)

## 2024-01-29 MED ORDER — NA SULFATE-K SULFATE-MG SULF 17.5-3.13-1.6 GM/177ML PO SOLN: 1.0000 | Freq: Once | ORAL | 0 refills | Status: AC

## 2024-01-29 NOTE — Patient Instructions (Addendum)
 Constipation Continue Linzess 145 mcg po daily  Can use Stool softeners as needed- they are used to prevent getting constipated and stop straining with bowel movements. Drink plenty of water  Rectal pain Can try suppositories to see if it helps No straining or straining Use water only to clean the area  Sitz baths may help   Your provider has requested that you go to the basement level for lab work before leaving today. Press B on the elevator. The lab is located at the first door on the left as you exit the elevator.   We have sent the following medications to your pharmacy for you to pick up at your convenience: SUPREP  You have been scheduled for a colonoscopy. Please follow written instructions given to you at your visit today.   If you use inhalers (even only as needed), please bring them with you on the day of your procedure.  DO NOT TAKE 7 DAYS PRIOR TO TEST- Trulicity (dulaglutide) Ozempic, Wegovy (semaglutide) Mounjaro, Zepbound (tirzepatide) Bydureon Bcise (exanatide extended release)  DO NOT TAKE 1 DAY PRIOR TO YOUR TEST Rybelsus (semaglutide) Adlyxin (lixisenatide) Victoza (liraglutide) Byetta (exanatide) ___________________________________________________________________________  Due to recent changes in healthcare laws, you may see the results of your imaging and laboratory studies on MyChart before your provider has had a chance to review them.  We understand that in some cases there may be results that are confusing or concerning to you. Not all laboratory results come back in the same time frame and the provider may be waiting for multiple results in order to interpret others.  Please give us  48 hours in order for your provider to thoroughly review all the results before contacting the office for clarification of your results.   _______________________________________________________  If your blood pressure at your visit was 140/90 or greater, please contact  your primary care physician to follow up on this.  _______________________________________________________  If you are age 36 or older, your body mass index should be between 23-30. Your Body mass index is 24.4 kg/m. If this is out of the aforementioned range listed, please consider follow up with your Primary Care Provider.  If you are age 46 or younger, your body mass index should be between 19-25. Your Body mass index is 24.4 kg/m. If this is out of the aformentioned range listed, please consider follow up with your Primary Care Provider.   ________________________________________________________  The [facility] providers would like to encourage you to use MYCHART to communicate with providers for non-urgent requests or questions.  Due to long hold times on the telephone, sending your provider a message by [facility] may be a faster and more efficient way to get a response.  Please allow 48 business hours for a response.  Please remember that this is for non-urgent requests.  _______________________________________________________  [facility] is using a team-based approach to care.  Your team is made up of your doctor and two to three APPS. Our APPS (Nurse Practitioners and Physician Assistants) work with your physician to ensure care continuity for you. They are fully qualified to address your health concerns and develop a treatment plan. They communicate directly with your gastroenterologist to care for you. Seeing the Advanced Practice Practitioners on your physician's team can help you by facilitating care more promptly, often allowing for earlier appointments, access to diagnostic testing, procedures, and other specialty referrals.   Thank you for trusting me with your gastrointestinal care. Deanna May, FNP-C
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
## 2024-01-30 ENCOUNTER — Ambulatory Visit: Payer: Self-pay | Admitting: Gastroenterology

## 2024-01-30 LAB — TSH: TSH: 0.59 u[IU]/mL (ref 0.35–5.50)

## 2024-01-31 ENCOUNTER — Other Ambulatory Visit: Payer: Self-pay

## 2024-01-31 ENCOUNTER — Encounter (HOSPITAL_BASED_OUTPATIENT_CLINIC_OR_DEPARTMENT_OTHER): Payer: Self-pay | Admitting: Emergency Medicine

## 2024-01-31 LAB — COMPREHENSIVE METABOLIC PANEL WITH GFR
ALT: 8 U/L (ref 0–44)
AST: 13 U/L — ABNORMAL LOW (ref 15–41)
Albumin: 4.6 g/dL (ref 3.5–5.0)
Alkaline Phosphatase: 48 U/L (ref 38–126)
Anion gap: 14 (ref 5–15)
BUN: 8 mg/dL (ref 6–20)
CO2: 24 mmol/L (ref 22–32)
Calcium: 9.6 mg/dL (ref 8.9–10.3)
Chloride: 102 mmol/L (ref 98–111)
Creatinine, Ser: 0.93 mg/dL (ref 0.61–1.24)
GFR, Estimated: >60 mL/min
Glucose, Bld: 124 mg/dL — ABNORMAL HIGH (ref 70–99)
Potassium: 3.7 mmol/L (ref 3.5–5.1)
Sodium: 139 mmol/L (ref 135–145)
Total Bilirubin: 0.5 mg/dL (ref 0.0–1.2)
Total Protein: 8.2 g/dL — ABNORMAL HIGH (ref 6.5–8.1)

## 2024-01-31 LAB — CBC
HCT: 41.1 % (ref 39.0–52.0)
Hemoglobin: 13.6 g/dL (ref 13.0–17.0)
MCH: 28.7 pg (ref 26.0–34.0)
MCHC: 33.1 g/dL (ref 30.0–36.0)
MCV: 86.7 fL (ref 80.0–100.0)
Platelets: 330 10*3/uL (ref 150–400)
RBC: 4.74 MIL/uL (ref 4.22–5.81)
RDW: 14.2 % (ref 11.5–15.5)
WBC: 10.6 10*3/uL — ABNORMAL HIGH (ref 4.0–10.5)
nRBC: 0 % (ref 0.0–0.2)

## 2024-01-31 LAB — LIPASE, BLOOD: Lipase: 31 U/L (ref 11–51)

## 2024-01-31 NOTE — ED Triage Notes (Signed)
"   Pt c/o rectal pain x 3 days, recurrent  recently seen by GI for same. "

## 2024-02-01 ENCOUNTER — Observation Stay (HOSPITAL_BASED_OUTPATIENT_CLINIC_OR_DEPARTMENT_OTHER)
Admission: EM | Admit: 2024-02-01 | Discharge: 2024-02-02 | Disposition: A | Attending: Family Medicine | Admitting: Family Medicine

## 2024-02-01 ENCOUNTER — Emergency Department (HOSPITAL_COMMUNITY): Admitting: Certified Registered"

## 2024-02-01 ENCOUNTER — Encounter (HOSPITAL_COMMUNITY): Admission: EM | Disposition: A | Payer: Self-pay | Source: Home / Self Care | Attending: Emergency Medicine

## 2024-02-01 ENCOUNTER — Encounter (HOSPITAL_BASED_OUTPATIENT_CLINIC_OR_DEPARTMENT_OTHER): Payer: Self-pay | Admitting: Internal Medicine

## 2024-02-01 ENCOUNTER — Emergency Department (HOSPITAL_BASED_OUTPATIENT_CLINIC_OR_DEPARTMENT_OTHER)

## 2024-02-01 DIAGNOSIS — K611 Rectal abscess: Principal | ICD-10-CM | POA: Diagnosis present

## 2024-02-01 DIAGNOSIS — D72829 Elevated white blood cell count, unspecified: Secondary | ICD-10-CM

## 2024-02-01 DIAGNOSIS — Z7251 High risk heterosexual behavior: Secondary | ICD-10-CM

## 2024-02-01 DIAGNOSIS — K5900 Constipation, unspecified: Secondary | ICD-10-CM | POA: Diagnosis present

## 2024-02-01 DIAGNOSIS — F1010 Alcohol abuse, uncomplicated: Secondary | ICD-10-CM

## 2024-02-01 LAB — URINALYSIS, ROUTINE W REFLEX MICROSCOPIC
Bilirubin Urine: NEGATIVE
Glucose, UA: NEGATIVE mg/dL
Hgb urine dipstick: NEGATIVE
Ketones, ur: NEGATIVE mg/dL
Leukocytes,Ua: NEGATIVE
Nitrite: NEGATIVE
Protein, ur: NEGATIVE mg/dL
Specific Gravity, Urine: 1.02 (ref 1.005–1.030)
pH: 6.5 (ref 5.0–8.0)

## 2024-02-01 MED ORDER — LACTATED RINGERS IV BOLUS
500.0000 mL | Freq: Once | INTRAVENOUS | Status: AC
  Administered 2024-02-01: 500 mL via INTRAVENOUS

## 2024-02-01 MED ORDER — MORPHINE SULFATE (PF) 4 MG/ML IV SOLN
6.0000 mg | Freq: Once | INTRAVENOUS | Status: AC
  Administered 2024-02-01: 6 mg via INTRAVENOUS
  Filled 2024-02-01: qty 2

## 2024-02-01 MED ORDER — PROPOFOL 10 MG/ML IV BOLUS
INTRAVENOUS | Status: AC
  Filled 2024-02-01: qty 20

## 2024-02-01 MED ORDER — MIDAZOLAM HCL (PF) 2 MG/2ML IJ SOLN
INTRAMUSCULAR | Status: DC | PRN
  Administered 2024-02-01: 2 mg via INTRAVENOUS

## 2024-02-01 MED ORDER — ONDANSETRON HCL 4 MG/2ML IJ SOLN: 4.0000 mg | Freq: Four times a day (QID) | INTRAMUSCULAR | Status: DC | PRN

## 2024-02-01 MED ORDER — MIDAZOLAM HCL 2 MG/2ML IJ SOLN
INTRAMUSCULAR | Status: AC
  Filled 2024-02-01: qty 2

## 2024-02-01 MED ORDER — OXYCODONE HCL 5 MG/5ML PO SOLN: 5.0000 mg | Freq: Once | ORAL | Status: DC | PRN

## 2024-02-01 MED ORDER — PROPOFOL 10 MG/ML IV BOLUS
INTRAVENOUS | Status: DC | PRN
  Administered 2024-02-01: 200 mg via INTRAVENOUS

## 2024-02-01 MED ORDER — ACETAMINOPHEN 650 MG RE SUPP: 650.0000 mg | Freq: Four times a day (QID) | RECTAL | Status: DC | PRN

## 2024-02-01 MED ORDER — AMISULPRIDE (ANTIEMETIC) 5 MG/2ML IV SOLN: 10.0000 mg | Freq: Once | INTRAVENOUS | Status: DC | PRN

## 2024-02-01 MED ORDER — LORAZEPAM 2 MG/ML IJ SOLN: 1.0000 mg | INTRAMUSCULAR | Status: DC | PRN

## 2024-02-01 MED ORDER — PIPERACILLIN-TAZOBACTAM 3.375 G IVPB 30 MIN
3.3750 g | Freq: Once | INTRAVENOUS | Status: AC
  Administered 2024-02-01: 3.375 g via INTRAVENOUS
  Filled 2024-02-01: qty 50

## 2024-02-01 MED ORDER — ACETAMINOPHEN 325 MG PO TABS: 650.0000 mg | ORAL_TABLET | Freq: Four times a day (QID) | ORAL | Status: DC | PRN

## 2024-02-01 MED ORDER — OXYCODONE HCL 5 MG PO TABS: 5.0000 mg | ORAL_TABLET | Freq: Once | ORAL | Status: DC | PRN

## 2024-02-01 MED ORDER — LACTATED RINGERS IV SOLN: INTRAVENOUS | Status: DC | PRN

## 2024-02-01 MED ORDER — DEXMEDETOMIDINE HCL IN NACL 80 MCG/20ML IV SOLN
INTRAVENOUS | Status: DC | PRN
  Administered 2024-02-01: 8 ug via INTRAVENOUS

## 2024-02-01 MED ORDER — ACETAMINOPHEN 10 MG/ML IV SOLN
1000.0000 mg | Freq: Once | INTRAVENOUS | Status: DC | PRN
  Administered 2024-02-01: 1000 mg via INTRAVENOUS

## 2024-02-01 MED ORDER — IBUPROFEN 200 MG PO TABS: 800.0000 mg | ORAL_TABLET | Freq: Three times a day (TID) | ORAL | Status: DC | PRN

## 2024-02-01 MED ORDER — ONDANSETRON HCL 4 MG PO TABS: 4.0000 mg | ORAL_TABLET | Freq: Four times a day (QID) | ORAL | Status: DC | PRN

## 2024-02-01 MED ORDER — IOHEXOL 300 MG/ML  SOLN
100.0000 mL | Freq: Once | INTRAMUSCULAR | Status: AC | PRN
  Administered 2024-02-01: 100 mL via INTRAVENOUS

## 2024-02-01 MED ORDER — KETOROLAC TROMETHAMINE 30 MG/ML IJ SOLN
INTRAMUSCULAR | Status: AC
  Filled 2024-02-01: qty 1

## 2024-02-01 MED ORDER — KETOROLAC TROMETHAMINE 30 MG/ML IJ SOLN
INTRAMUSCULAR | Status: DC | PRN
  Administered 2024-02-01: 30 mg via INTRAVENOUS

## 2024-02-01 MED ORDER — LACTATED RINGERS IV BOLUS
1000.0000 mL | Freq: Once | INTRAVENOUS | Status: AC
  Administered 2024-02-01: 1000 mL via INTRAVENOUS

## 2024-02-01 MED ORDER — FENTANYL CITRATE (PF) 100 MCG/2ML IJ SOLN
INTRAMUSCULAR | Status: DC | PRN
  Administered 2024-02-01 (×3): 50 ug via INTRAVENOUS

## 2024-02-01 MED ORDER — LIDOCAINE HCL (PF) 2 % IJ SOLN
INTRAMUSCULAR | Status: DC | PRN
  Administered 2024-02-01: 80 mg via INTRADERMAL

## 2024-02-01 MED ORDER — BUPIVACAINE-EPINEPHRINE (PF) 0.25% -1:200000 IJ SOLN
INTRAMUSCULAR | Status: AC
  Filled 2024-02-01: qty 30

## 2024-02-01 MED ORDER — HEPARIN SODIUM (PORCINE) 5000 UNIT/ML IJ SOLN
5000.0000 [IU] | Freq: Three times a day (TID) | INTRAMUSCULAR | Status: DC
  Administered 2024-02-01 – 2024-02-02 (×2): 5000 [IU] via SUBCUTANEOUS
  Filled 2024-02-01 (×2): qty 1

## 2024-02-01 MED ORDER — SENNOSIDES-DOCUSATE SODIUM 8.6-50 MG PO TABS
1.0000 | ORAL_TABLET | Freq: Two times a day (BID) | ORAL | Status: DC
  Administered 2024-02-01 – 2024-02-02 (×2): 1 via ORAL
  Filled 2024-02-01 (×2): qty 1

## 2024-02-01 MED ORDER — MORPHINE SULFATE (PF) 4 MG/ML IV SOLN
4.0000 mg | INTRAVENOUS | Status: DC | PRN
  Administered 2024-02-01 (×2): 4 mg via INTRAVENOUS
  Filled 2024-02-01 (×2): qty 1

## 2024-02-01 MED ORDER — ONDANSETRON HCL 4 MG/2ML IJ SOLN
4.0000 mg | Freq: Once | INTRAMUSCULAR | Status: AC
  Administered 2024-02-01: 4 mg via INTRAVENOUS
  Filled 2024-02-01: qty 2

## 2024-02-01 MED ORDER — POLYETHYLENE GLYCOL 3350 17 G PO PACK
17.0000 g | PACK | Freq: Two times a day (BID) | ORAL | Status: DC
  Administered 2024-02-01 – 2024-02-02 (×2): 17 g via ORAL
  Filled 2024-02-01 (×2): qty 1

## 2024-02-01 MED ORDER — FENTANYL CITRATE (PF) 100 MCG/2ML IJ SOLN
INTRAMUSCULAR | Status: AC
  Filled 2024-02-01: qty 2

## 2024-02-01 MED ORDER — HYDROMORPHONE HCL 1 MG/ML IJ SOLN: 0.2500 mg | INTRAMUSCULAR | Status: DC | PRN

## 2024-02-01 MED ORDER — LIDOCAINE HCL (PF) 2 % IJ SOLN
INTRAMUSCULAR | Status: AC
  Filled 2024-02-01: qty 5

## 2024-02-01 MED ORDER — PIPERACILLIN-TAZOBACTAM 3.375 G IVPB
3.3750 g | Freq: Three times a day (TID) | INTRAVENOUS | Status: DC
  Administered 2024-02-01 – 2024-02-02 (×3): 3.375 g via INTRAVENOUS
  Filled 2024-02-01 (×3): qty 50

## 2024-02-01 MED ORDER — DEXAMETHASONE SOD PHOSPHATE PF 10 MG/ML IJ SOLN
INTRAMUSCULAR | Status: DC | PRN
  Administered 2024-02-01: 8 mg via INTRAVENOUS

## 2024-02-01 MED ORDER — BUPIVACAINE-EPINEPHRINE (PF) 0.25% -1:200000 IJ SOLN
INTRAMUSCULAR | Status: DC | PRN
  Administered 2024-02-01: 30 mL

## 2024-02-01 MED ORDER — 0.9 % SODIUM CHLORIDE (POUR BTL) OPTIME
TOPICAL | Status: DC | PRN
  Administered 2024-02-01: 1000 mL

## 2024-02-01 MED ORDER — ACETAMINOPHEN 10 MG/ML IV SOLN
INTRAVENOUS | Status: AC
  Filled 2024-02-01: qty 100

## 2024-02-01 MED ORDER — ONDANSETRON HCL 4 MG/2ML IJ SOLN
INTRAMUSCULAR | Status: DC | PRN
  Administered 2024-02-01: 4 mg via INTRAVENOUS

## 2024-02-01 NOTE — Anesthesia Preprocedure Evaluation (Signed)
 "                                  Anesthesia Evaluation  Patient identified by MRN, date of birth, ID band Patient awake    Reviewed: Allergy & Precautions, H&P , NPO status , Patient's Chart, lab work & pertinent test results  Airway Mallampati: I  TM Distance: >3 FB Neck ROM: Full    Dental  (+) Teeth Intact, Dental Advisory Given   Pulmonary neg shortness of breath, neg COPD, Recent URI , Residual Cough, Current Smoker and Patient abstained from smoking.   breath sounds clear to auscultation       Cardiovascular negative cardio ROS  Rhythm:Regular     Neuro/Psych negative neurological ROS  negative psych ROS   GI/Hepatic Neg liver ROS,,,Perirectal abscess    Endo/Other  negative endocrine ROS    Renal/GU negative Renal ROS     Musculoskeletal negative musculoskeletal ROS (+)    Abdominal   Peds  Hematology negative hematology ROS (+) Lab Results      Component                Value               Date                      WBC                      10.6 (H)            01/31/2024                HGB                      13.6                01/31/2024                HCT                      41.1                01/31/2024                MCV                      86.7                01/31/2024                PLT                      330                 01/31/2024              Anesthesia Other Findings   Reproductive/Obstetrics                              Anesthesia Physical Anesthesia Plan  ASA: 2  Anesthesia Plan: General   Post-op Pain Management: Ofirmev  IV (intra-op)* and Toradol  IV (intra-op)*   Induction: Intravenous  PONV Risk Score and Plan: 2 and Ondansetron , Dexamethasone  and Midazolam   Airway Management Planned: LMA  Additional Equipment: None  Intra-op Plan:  Post-operative Plan: Extubation in OR  Informed Consent: I have reviewed the patients History and Physical, chart, labs and discussed the  procedure including the risks, benefits and alternatives for the proposed anesthesia with the patient or authorized representative who has indicated his/her understanding and acceptance.     Dental advisory given  Plan Discussed with: CRNA  Anesthesia Plan Comments:         Anesthesia Quick Evaluation  "

## 2024-02-01 NOTE — Assessment & Plan Note (Signed)
 Check HIV, gonorrhea, chlamydia via urine sample, RPR

## 2024-02-01 NOTE — Assessment & Plan Note (Signed)
 In setting of perirectal abscess Continue Zosyn  per pharmacy CBC in the a.m.

## 2024-02-01 NOTE — Assessment & Plan Note (Signed)
 Will place on MiraLAX , senna docusate

## 2024-02-01 NOTE — Assessment & Plan Note (Signed)
 Patient self reports that he drinks only 4-6 liquor drinks/shots per week. I have low suspicion of alcohol withdrawal risk however I did order Ativan  1 mg as needed as needed anxiety, tremors, 2 doses with instructions to administer as appropriate Provider know.  CIWA precaution can be initiated at that time.

## 2024-02-01 NOTE — ED Notes (Signed)
 Taken to CT.

## 2024-02-01 NOTE — Hospital Course (Addendum)
 Mr. Juan Patterson is a 34 year old male with history of perirectal abscess, chronic constipation, history of high risk sexual practices admitted with acute concerns of rectal bleeding and perirectal pain.  Vitals in the ED showed T of 97.6, rr 20, hr 69, blood pressure 131/76, SpO2 100% on RA.  Serum sodium is 139, bicarb 24, BUN of 8, sCr 0.93, eGFR > 60, nonfasting blood glucose 124, WBC 10.6, hemoglobin 13.6, platelets of 330.  UA is negative for leukocytes and nitrates.  Blood cultures x 2 are in process.  ED treatment: Morphine  6 mg IV one-time dose, 4 mg IV one-time dose, LR 1 L, and Zosyn  per pharmacy.

## 2024-02-01 NOTE — Plan of Care (Signed)
 This 34 yrs old Male with PMH significant for perirectal abscess, chronic constipation, history of high risk sexual practices admitted with acute perirectal pain associated with rectal bleeding.  Patient is hemodynamically stable.  UA is negative for leukocytes and nitrites.  Patient is admitted for further evaluation.  General surgery is consulted.  Patient is started on IV Zosyn , stool softeners, and CIWA protocol given history of EtOH use. Patient was seen and examined at bedside.

## 2024-02-01 NOTE — Assessment & Plan Note (Signed)
 Recurrent for the last 2 to 3 years per patient Continue Zosyn  per pharmacy General Surgery has been consulted and recommending surgery N.p.o. per general surgery

## 2024-02-01 NOTE — ED Notes (Signed)
 Last PO intake yesterday. Unsure of the time
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
## 2024-02-01 NOTE — Anesthesia Procedure Notes (Signed)
 Procedure Name: LMA Insertion Date/Time: 02/01/2024 11:04 AM  Performed by: Para Jerelene CROME, CRNAPre-anesthesia Checklist: Patient identified, Emergency Drugs available, Suction available and Patient being monitored Patient Re-evaluated:Patient Re-evaluated prior to induction Oxygen Delivery Method: Circle system utilized Preoxygenation: Pre-oxygenation with 100% oxygen Induction Type: IV induction LMA: LMA inserted LMA Size: 4.0 Number of attempts: 1 Placement Confirmation: positive ETCO2, CO2 detector and breath sounds checked- equal and bilateral Tube secured with: Tape Dental Injury: Teeth and Oropharynx as per pre-operative assessment  Comments: Atraumatic insertion of LMA size 4. Lips and teeth remain in preoperative condition.

## 2024-02-01 NOTE — Progress Notes (Signed)
 Pharmacy Antibiotic Note  Juan Patterson is a 34 y.o. male admitted on 02/01/2024 with IA infection.  Pharmacy has been consulted for Zosyn  dosing.  Plan: Zosyn  3.375g IV q8h (4 hour infusion).  Weight: 93.3 kg (205 lb 12.8 oz)  Temp (24hrs), Avg:98.4 F (36.9 C), Min:97.6 F (36.4 C), Max:99.4 F (37.4 C)  Recent Labs  Lab 01/29/24 1540 01/31/24 2236  WBC 7.1 10.6*  CREATININE 0.86 0.93    Estimated Creatinine Clearance: 141 mL/min (by C-G formula based on SCr of 0.93 mg/dL).    Allergies[1]  Microbiology results: Pending   Thank you for allowing pharmacy to be a part of this patients care.  Juan Patterson 02/01/2024 8:55 AM     [1] No Known Allergies

## 2024-02-01 NOTE — Plan of Care (Signed)
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
## 2024-02-01 NOTE — Op Note (Signed)
 Preoperative diagnosis: perirectal abscess  Postoperative diagnosis: same   Procedure: incision and drainage of perirectal abscess, seton placement  Surgeon: Herlene Bureau, M.D.  Asst: none  Anesthesia: general LMA  Indications for procedure: Juan Patterson is a 34 y.o. year old male with symptoms of perianal pain and drainage.  Description of procedure: The patient was brought into the operative suite. Anesthesia was administered with General LMA anesthesia. WHO checklist was applied. The patient was then placed in lithotomy position. The area was prepped and draped in the usual sterile fashion.  Next, the area was examined, there was a firmness in the left posterior area. Marcaine  was injected into the area. A rectal exam was performed and anal retractor put in place. There was an area over the posterior with purulence seen. A lacrimal duct probe was inserted and moved into a space posteriorly. This area appeared to be above the sphincters. Next, a stab incision was made in the poster left perianal area and hemostat used to gain access to the abscess cavity and large purulence was drained out. A vessel loop was looped through the internal opening to the created external opening and sutured together in a loop. The patient tolerated the procedure was brought to PACU in stable condition.  Findings: perirectal abscess  Specimen: abscess  Implant: vessel loop seton   Blood loss: 10 ml  Local anesthesia: 20 ml marcaine    Complications: none  Herlene Bureau, M.D. General, Bariatric, & Minimally Invasive Surgery [facility]

## 2024-02-01 NOTE — Transfer of Care (Signed)
 Immediate Anesthesia Transfer of Care Note  Patient: Juan Patterson  Procedure(s) Performed: INCISION AND DRAINAGE, ABSCESS, PERIRECTAL (Rectum) PLACEMENT, SETON (Rectum)  Patient Location: PACU  Anesthesia Type:General  Level of Consciousness: drowsy and patient cooperative  Airway & Oxygen Therapy: Patient Spontanous Breathing and Patient connected to face mask oxygen  Post-op Assessment: Report given to RN and Post -op Vital signs reviewed and stable  Post vital signs: Reviewed and stable  Last Vitals:  Vitals Value Taken Time  BP 108/63 02/01/24 11:50  Temp 37 C 02/01/24 11:52  Pulse 63 02/01/24 11:54  Resp 16 02/01/24 11:54  SpO2 100 % 02/01/24 11:54  Vitals shown include unfiled device data.  Last Pain:  Vitals:   02/01/24 1152  TempSrc:   PainSc: Asleep         Complications: No notable events documented.

## 2024-02-01 NOTE — ED Notes (Signed)
 Care link in to transport. Report given to Short Stay

## 2024-02-01 NOTE — ED Provider Notes (Signed)
 " [place] [facility] Provider Note   CSN: [id number] Arrival date & time: 01/31/24  2223     History Chief Complaint  Patient presents with   Rectal Bleeding    HPI Bilal Manzer is a 34 y.o. male presenting for chief complaint of rectal pain. States that over the past few days he started having this pain. States that he is having persistent pain over the past few days States a history of similar.  History of recurrent perirectal abscesses.  Had to be admitted on IV antibiotics a year ago with a near identical presentation at that time. Patient's recorded medical, surgical, social, medication list and allergies were reviewed in the Snapshot window as part of the initial history.   Review of Systems   Review of Systems  Constitutional:  Negative for chills and fever.  HENT:  Negative for ear pain and sore throat.   Eyes:  Negative for pain and visual disturbance.  Respiratory:  Negative for cough and shortness of breath.   Cardiovascular:  Negative for chest pain and palpitations.  Gastrointestinal:  Positive for rectal pain. Negative for abdominal pain and vomiting.  Genitourinary:  Negative for dysuria and hematuria.  Musculoskeletal:  Negative for arthralgias and back pain.  Skin:  Negative for color change and rash.  Neurological:  Negative for seizures and syncope.  All other systems reviewed and are negative.   Physical Exam Updated Vital Signs BP 130/76 (BP Location: Right Arm)   Pulse 73   Temp 98.9 F (37.2 C) (Oral)   Resp 20   Wt 93.3 kg   SpO2 99%   BMI 24.40 kg/m  Physical Exam Vitals and nursing note reviewed.  Constitutional:      General: He is not in acute distress.    Appearance: He is well-developed.  HENT:     Head: Normocephalic and atraumatic.  Eyes:     Conjunctiva/sclera: Conjunctivae normal.  Cardiovascular:     Rate and Rhythm: Normal rate and regular rhythm.  Pulmonary:     Effort: Pulmonary effort  is normal. No respiratory distress.  Abdominal:     General: Abdomen is flat. There is no distension.  Musculoskeletal:        General: No swelling or deformity.  Skin:    General: Skin is warm and dry.     Capillary Refill: Capillary refill takes less than 2 seconds.  Neurological:     Mental Status: He is alert and oriented to person, place, and time. Mental status is at baseline.      ED Course/ Medical Decision Making/ A&P    Procedures Procedures   Medications Ordered in ED Medications  morphine  (PF) 4 MG/ML injection 6 mg (6 mg Intravenous Given 02/01/24 0238)  lactated ringers  bolus 1,000 mL (0 mLs Intravenous Stopped 02/01/24 0415)  ondansetron  (ZOFRAN ) injection 4 mg (4 mg Intravenous Given 02/01/24 0241)  iohexol  (OMNIPAQUE ) 300 MG/ML solution 100 mL (100 mLs Intravenous Contrast Given 02/01/24 0307)   Medical Decision Making:   Kushal Saunders is a 34 y.o. male who presented to the ED today with abdominal pain, detailed above.    Patient placed on continuous vitals and telemetry monitoring while in ED which was reviewed periodically.  Complete initial physical exam performed, notably the patient  was HDS in NAD.     Reviewed and confirmed nursing documentation for past medical history, family history, social history.    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 Other diagnoses were considered including (but not limited to) gastroenteritis, colitis, small bowel obstruction, appendicitis, cholecystitis, pancreatitis, nephrolithiasis, UTI, pyleonephritis. These are considered less likely due to history of present illness and physical exam findings.   This is most consistent with an acute life/limb threatening illness complicated by underlying chronic conditions.   Initial Plan:  CBC/CMP to evaluate for underlying infectious/metabolic etiology for patient's abdominal pain  Lipase to evaluate for  pancreatitis  EKG to evaluate for cardiac source of pain  CTAB/Pelvis with contrast to evaluate for structural/surgical etiology of patients' severe abdominal pain.  Urinalysis and repeat physical assessment to evaluate for UTI/Pyelonpehritis  Empiric management of symptoms with escalating pain control and antiemetics as needed.   Initial Study Results:   Laboratory  All laboratory results reviewed without evidence of clinically relevant pathology.    EKG EKG was reviewed independently. Rate, rhythm, axis, intervals all examined and without medically relevant abnormality. ST segments without concerns for elevations.    Radiology All images reviewed independently. Agree with radiology report at this time.   CT ABDOMEN PELVIS W CONTRAST Result Date: 02/01/2024 EXAM: CT ABDOMEN AND PELVIS WITH CONTRAST 02/01/2024 03:05:00 AM TECHNIQUE: CT of the abdomen and pelvis was performed with the administration of 100 mL of iohexol  (OMNIPAQUE ) 300 MG/ML solution. Multiplanar reformatted images are provided for review. Automated exposure control, iterative reconstruction, and/or weight-based adjustment of the mA/kV was utilized to reduce the radiation dose to as low as reasonably achievable. COMPARISON: 01/07/2022. CLINICAL HISTORY: Rectal pain for 3 days. FINDINGS: LOWER CHEST: No acute abnormality. LIVER: No suspicious liver lesions. 1.8 cm subcapsular lesion over segment 4 is unchanged from the previous exam consistent with a benign etiology. No follow-up imaging recommended. GALLBLADDER AND BILE DUCTS: Gallstones are noted measuring up to 6 mm. No gallbladder wall thickening or surrounding inflammation. No biliary ductal dilatation. SPLEEN: The spleen is within normal limits in size and appearance. PANCREAS: The pancreas is normal in size and contour without focal lesion or ductal dilatation. ADRENAL GLANDS: No acute abnormality. KIDNEYS, URETERS AND BLADDER: No stones in the kidneys or ureters. No  hydronephrosis. No perinephric or periureteral stranding. Urinary bladder is unremarkable. GI AND BOWEL: The stomach appears nondistended. No pathologic dilatation of the bowel loops. Normal appearance of the terminal ileum. The appendix is visualized and appears normal. Mild-to-moderate colonic stool burden within the ascending and proximal transverse colon. There is equivocal wall thickening of the lower rectum without surrounding inflammatory soft tissue stranding. There is a small fluid collection identified along the posterior aspect of the distal rectum measuring 3.3 x 1.5 x 1.6 cm, axial image 84/2 and sagittal image 90/7. No fistula identified. PERITONEUM AND RETROPERITONEUM: No free fluid. No signs of pneumoperitoneum. VASCULATURE: Aorta is normal in caliber. Advanced atherosclerotic calcifications involving the abdominal aorta and proximal iliac vessels. LYMPH NODES: No abdominal adenopathy. Prominent pelvic sidewall lymph nodes measure up to 9 mm short axis. Likely reactive in the setting of perianal abscess. REPRODUCTIVE ORGANS: The prostate gland is normal. BONES AND SOFT TISSUES: Visualized osseous structures are normal. No focal soft tissue abnormality. IMPRESSION: 1. Small perirectal fluid collection along the posterior distal rectum measuring 3.3 x 1.5 x 1.6 cm, most consistent with a perirectal abscess, without fistula, free fluid, or pneumoperitoneum; recommend colorectal surgery consultation for management and follow-up to document resolution (MRI pelvis or repeat CT if symptoms persist or worsen). 2. Equivocal lower rectal wall thickening, favored mild proctitis given the adjacent  collection; consider endoscopic evaluation after acute symptoms improve to exclude an underlying mucosal lesion if clinically indicated. 3. Prominent pelvic sidewall lymph nodes, likely reactive in the setting of perirectal infection. Electronically signed by: Waddell Calk MD 02/01/2024 06:24 AM EST RP Workstation:  [id number]   Final Reassessment and Plan:   CT scan showed recurrent perirectal abscess.  Consulted general surgery who recommended antibiotic management with medical admission.  They stated they would consult at bedside.  Appreciate Dr. Yvonne recommendations.  Pending medical admission at time of signout to oncoming team. Disposition:   Based on the above findings, I believe this patient is stable for admission.    Patient/family educated about specific findings on our evaluation and explained exact reasons for admission.  Patient/family educated about clinical situation and time was allowed to answer questions.   Admission team communicated with and agreed with need for admission. Patient admitted. Patient ready to move at this time.     Emergency Department Medication Summary:   Medications  morphine  (PF) 4 MG/ML injection 6 mg (6 mg Intravenous Given 02/01/24 0238)  lactated ringers  bolus 1,000 mL (0 mLs Intravenous Stopped 02/01/24 0415)  ondansetron  (ZOFRAN ) injection 4 mg (4 mg Intravenous Given 02/01/24 0241)  iohexol  (OMNIPAQUE ) 300 MG/ML solution 100 mL (100 mLs Intravenous Contrast Given 02/01/24 0307)         Emergency Department Medication Summary:   Medications  morphine  (PF) 4 MG/ML injection 6 mg (6 mg Intravenous Given 02/01/24 0238)  lactated ringers  bolus 1,000 mL (0 mLs Intravenous Stopped 02/01/24 0415)  ondansetron  (ZOFRAN ) injection 4 mg (4 mg Intravenous Given 02/01/24 0241)  iohexol  (OMNIPAQUE ) 300 MG/ML solution 100 mL (100 mLs Intravenous Contrast Given 02/01/24 0307)            Clinical Impression:  1. Perirectal abscess      Data Unavailable   Final Clinical Impression(s) / ED Diagnoses Final diagnoses:  Perirectal abscess    Rx / DC Orders ED Discharge Orders     None         Jerral Meth, MD 02/01/24 [phone]  "

## 2024-02-01 NOTE — ED Notes (Signed)
 Pt updated on transfer plan. Transfer consent signed

## 2024-02-02 ENCOUNTER — Encounter (HOSPITAL_COMMUNITY): Payer: Self-pay | Admitting: General Surgery

## 2024-02-02 ENCOUNTER — Other Ambulatory Visit (HOSPITAL_COMMUNITY): Payer: Self-pay

## 2024-02-02 LAB — CBC
HCT: 39.6 % (ref 39.0–52.0)
Hemoglobin: 13.5 g/dL (ref 13.0–17.0)
MCH: 29.5 pg (ref 26.0–34.0)
MCHC: 34.1 g/dL (ref 30.0–36.0)
MCV: 86.5 fL (ref 80.0–100.0)
Platelets: 324 10*3/uL (ref 150–400)
RBC: 4.58 MIL/uL (ref 4.22–5.81)
RDW: 13.6 % (ref 11.5–15.5)
WBC: 12.1 10*3/uL — ABNORMAL HIGH (ref 4.0–10.5)
nRBC: 0 % (ref 0.0–0.2)

## 2024-02-02 LAB — BASIC METABOLIC PANEL WITH GFR
Anion gap: 9 (ref 5–15)
BUN: 13 mg/dL (ref 6–20)
CO2: 27 mmol/L (ref 22–32)
Calcium: 9.7 mg/dL (ref 8.9–10.3)
Chloride: 102 mmol/L (ref 98–111)
Creatinine, Ser: 0.95 mg/dL (ref 0.61–1.24)
GFR, Estimated: >60 mL/min
Glucose, Bld: 99 mg/dL (ref 70–99)
Potassium: 4.2 mmol/L (ref 3.5–5.1)
Sodium: 138 mmol/L (ref 135–145)

## 2024-02-02 LAB — SYPHILIS: RPR W/REFLEX TO RPR TITER AND TREPONEMAL ANTIBODIES, TRADITIONAL SCREENING AND DIAGNOSIS ALGORITHM: RPR Ser Ql: NONREACTIVE

## 2024-02-02 MED ORDER — TRAMADOL HCL 50 MG PO TABS
50.0000 mg | ORAL_TABLET | Freq: Three times a day (TID) | ORAL | 0 refills | Status: DC | PRN
  Filled 2024-02-02: qty 10, 4d supply, fill #0

## 2024-02-02 MED ORDER — SULFAMETHOXAZOLE-TRIMETHOPRIM 800-160 MG PO TABS
1.0000 | ORAL_TABLET | Freq: Two times a day (BID) | ORAL | 0 refills | Status: DC
  Filled 2024-02-02: qty 12, 6d supply, fill #0

## 2024-02-02 MED ORDER — TRAMADOL HCL 50 MG PO TABS: 50.0000 mg | ORAL_TABLET | Freq: Three times a day (TID) | ORAL | 0 refills | Status: AC | PRN

## 2024-02-02 MED ORDER — SULFAMETHOXAZOLE-TRIMETHOPRIM 800-160 MG PO TABS: 1.0000 | ORAL_TABLET | Freq: Two times a day (BID) | ORAL | 0 refills | Status: AC

## 2024-02-02 NOTE — Progress Notes (Signed)
" °  1 Day Post-Op   Chief Complaint/Subjective: Pain much improved, tolerated diet  Objective: Vital signs in last 24 hours: Temp:  [97.5 F (36.4 C)-98.9 F (37.2 C)] 98.1 F (36.7 C) (02/01 0610) Pulse Rate:  [49-62] 51 (02/01 0610) Resp:  [12-18] 16 (02/01 0610) BP: (96-143)/(54-96) 127/76 (02/01 0610) SpO2:  [98 %-100 %] 100 % (02/01 0610) Last BM Date : 01/30/24 Intake/Output from previous day: 01/31 0701 - 02/01 0700 In: 1450.2 [P.O.:1080; I.V.:300; IV Piggyback:70.2] Out: 1005 [Urine:1000; Blood:5]  PE: Gen: NAd Resp: nonlabored Card: bradycardic Abd: soft Rectal: drain in place, no purulence seen  Lab Results:  Recent Labs    01/31/24 2236 02/02/24 0517  WBC 10.6* 12.1*  HGB 13.6 13.5  HCT 41.1 39.6  PLT 330 324   Recent Labs    01/31/24 2236 02/02/24 0517  NA 139 138  K 3.7 4.2  CL 102 102  CO2 24 27  GLUCOSE 124* 99  BUN 8 13  CREATININE 0.93 0.95  CALCIUM 9.6 9.7   No results for input(s): LABPROT, INR in the last 72 hours.    Component Value Date/Time   NA 138 02/02/2024 0517   K 4.2 02/02/2024 0517   CL 102 02/02/2024 0517   CO2 27 02/02/2024 0517   GLUCOSE 99 02/02/2024 0517   BUN 13 02/02/2024 0517   CREATININE 0.95 02/02/2024 0517   CALCIUM 9.7 02/02/2024 0517   PROT 8.2 (H) 01/31/2024 2236   ALBUMIN 4.6 01/31/2024 2236   AST 13 (L) 01/31/2024 2236   ALT 8 01/31/2024 2236   ALKPHOS 48 01/31/2024 2236   BILITOT 0.5 01/31/2024 2236   GFRNONAA >60 02/02/2024 0517   GFRAA >60 08/28/2010 1941    Assessment/Plan  s/p Procedures: INCISION AND DRAINAGE, ABSCESS, PERIRECTAL PLACEMENT, SETON 02/01/2024   FEN - reg diet VTE - heparin  sq ID - zosyn , convert to bactrim  Disposition - hopefully home today, sitz baths at least once a day, ok to shower normal, follow up with me in 2 weeks   LOS: 0 days   I reviewed last 24 h vitals and pain scores, last 48 h intake and output, last 24 h labs and trends, and last 24 h imaging  results.  This care required moderate level of medical decision making.   Herlene Righter [facility] 02/02/2024, 8:46 AM Please see Amion for pager number during day hours 7:00am-4:30pm or 7:00am -11:30am on weekends   "

## 2024-02-02 NOTE — Discharge Instructions (Signed)
 [facility]__[facility]  RECTAL SURGERY POST OP INSTRUCTIONS: POST OP INSTRUCTIONS  Always review your discharge instruction sheet given to you by the facility where your surgery was performed. IF YOU HAVE DISABILITY OR FAMILY LEAVE FORMS, YOU MUST BRING THEM TO THE OFFICE FOR PROCESSING.   DO NOT GIVE THEM TO YOUR DOCTOR.  A  prescription for pain medication may be given to you upon discharge.  Take your pain medication as prescribed, if needed.  If narcotic pain medicine is not needed, then you may take acetaminophen  (Tylenol ) or ibuprofen  (Advil ) as needed. Take your usually prescribed medications unless otherwise directed. If you need a refill on your pain medication, please contact your pharmacy.  They will contact our office to request authorization. Prescriptions will not be filled after 5 pm or on week-ends. You should follow a light diet the first 48 hours after arrival home, such as soup and crackers, etc.  Be sure to include lots of fluids daily.  Resume your normal diet 2-3 days after surgery.. Most patients will experience some swelling and discomfort in the rectal area. Ice packs, reclining and warm tub soaks will help.  Swelling and discomfort can take several days to resolve.  It is common to experience some constipation if taking pain medication after surgery.  Increasing fluid intake and taking a stool softener (such as Colace) will usually help or prevent this problem from occurring.  A mild laxative (Milk of Magnesia or Miralax ) should be taken according to package directions if there are no bowel movements after 48 hours. Unless discharge instructions indicate otherwise, leave your bandage dry and in place for 24 hours, or remove the bandage if you have a bowel movement. You may notice a small amount of bleeding with bowel movements for the first few days. You may have some packing in the rectum which will come out over the first day or two. You will need to wear an  absorbent pad or soft cotton gauze in your underwear until the drainage stops.it. ACTIVITIES:  You may resume regular (light) daily activities beginning the next day--such as daily self-care, walking, climbing stairs--gradually increasing activities as tolerated.  You may have sexual intercourse when it is comfortable.   You may drive when you are no longer taking prescription pain medication, you can comfortably wear a seatbelt, and you can safely maneuver your car and apply brakes. You should see your doctor in the office for a follow-up appointment approximately 2-3 weeks after your surgery.  Make sure that you call for this appointment within a day or two after you arrive home to insure a convenient appointment time. OTHER INSTRUCTIONS:  __________________________________________________________________________________________________________________________________________________________________________________________  WHEN TO CALL YOUR DOCTOR: Fever over 101.0 Inability to urinate Nausea and/or vomiting Extreme swelling or bruising Continued bleeding from rectum. Increased pain, redness, or drainage from the incision Constipation  The clinic staff is available to answer your questions during regular business hours.  Please dont hesitate to call and ask to speak to one of the nurses for clinical concerns.  If you have a medical emergency, go to the nearest emergency room or call 911.  A surgeon from [facility] is always on call at the hospital   [address], [place], [place]  [address] ?  [address] [address], [place], [place]   [address] [phone] ? [phone] ? FAX [phone] Web site: [email or web address]
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
## 2024-02-02 NOTE — Plan of Care (Signed)
   Problem: Education: Goal: Knowledge of General Education information will improve Description Including pain rating scale, medication(s)/side effects and non-pharmacologic comfort measures Outcome: Progressing

## 2024-02-02 NOTE — Progress Notes (Signed)
 Assessment unchanged. Pt verbalized understanding of dc instructions through teach back including meds to resume, follow up care and when to call the MD. Discharged via foot per request to front entrance to meet ride. Accompanied by NT.

## 2024-02-03 LAB — MISC LABCORP TEST (SEND OUT): Labcorp test code: 83935

## 2024-02-03 LAB — GC/CHLAMYDIA PROBE AMP (~~LOC~~) NOT AT ARMC
Chlamydia: NEGATIVE
Comment: NEGATIVE
Comment: NORMAL
Neisseria Gonorrhea: NEGATIVE

## 2024-02-03 NOTE — Anesthesia Postprocedure Evaluation (Signed)
"   Anesthesia Post Note  Patient: Juan Patterson  Procedure(s) Performed: INCISION AND DRAINAGE, ABSCESS, PERIRECTAL (Rectum) PLACEMENT, SETON (Rectum)     Patient location during evaluation: PACU Anesthesia Type: General Level of consciousness: awake and alert Pain management: pain level controlled Vital Signs Assessment: post-procedure vital signs reviewed and stable Respiratory status: spontaneous breathing, nonlabored ventilation and respiratory function stable Cardiovascular status: blood pressure returned to baseline and stable Postop Assessment: no apparent nausea or vomiting Anesthetic complications: no   No notable events documented.               Averi Cacioppo      "

## 2024-02-06 LAB — CULTURE, BLOOD (ROUTINE X 2)
Culture: NO GROWTH
Culture: NO GROWTH
Culture: NO GROWTH
Special Requests: ADEQUATE
Special Requests: ADEQUATE
Special Requests: ADEQUATE

## 2024-02-06 LAB — AEROBIC/ANAEROBIC CULTURE W GRAM STAIN (SURGICAL/DEEP WOUND)

## 2024-02-10 ENCOUNTER — Encounter: Admitting: Pediatrics
